# Patient Record
Sex: Female | Born: 1940 | Race: White | Hispanic: No | Smoking: Never smoker
Health system: Southern US, Community
[De-identification: ages and names within clinical notes are randomized; demographics above are authoritative.]

## PROBLEM LIST (undated history)

## (undated) DIAGNOSIS — I471 Supraventricular tachycardia, unspecified: Secondary | ICD-10-CM

## (undated) DIAGNOSIS — M1990 Unspecified osteoarthritis, unspecified site: Secondary | ICD-10-CM

## (undated) DIAGNOSIS — J302 Other seasonal allergic rhinitis: Secondary | ICD-10-CM

## (undated) DIAGNOSIS — E785 Hyperlipidemia, unspecified: Secondary | ICD-10-CM

## (undated) DIAGNOSIS — H269 Unspecified cataract: Secondary | ICD-10-CM

## (undated) DIAGNOSIS — F419 Anxiety disorder, unspecified: Secondary | ICD-10-CM

## (undated) DIAGNOSIS — K219 Gastro-esophageal reflux disease without esophagitis: Secondary | ICD-10-CM

## (undated) DIAGNOSIS — I10 Essential (primary) hypertension: Secondary | ICD-10-CM

## (undated) DIAGNOSIS — I495 Sick sinus syndrome: Secondary | ICD-10-CM

## (undated) HISTORY — DX: Essential (primary) hypertension: I10

## (undated) HISTORY — DX: Other seasonal allergic rhinitis: J30.2

## (undated) HISTORY — DX: Supraventricular tachycardia, unspecified: I47.10

## (undated) HISTORY — DX: Sick sinus syndrome: I49.5

## (undated) HISTORY — DX: Hyperlipidemia, unspecified: E78.5

## (undated) HISTORY — DX: Supraventricular tachycardia: I47.1

## (undated) HISTORY — DX: Anxiety disorder, unspecified: F41.9

## (undated) HISTORY — PX: TUBAL LIGATION: SHX77

## (undated) HISTORY — DX: Unspecified cataract: H26.9

## (undated) HISTORY — DX: Unspecified osteoarthritis, unspecified site: M19.90

## (undated) HISTORY — DX: Gastro-esophageal reflux disease without esophagitis: K21.9

---

## 1992-01-27 DIAGNOSIS — H269 Unspecified cataract: Secondary | ICD-10-CM

## 1992-01-27 HISTORY — PX: CATARACT EXTRACTION, BILATERAL: SHX1313

## 1992-01-27 HISTORY — DX: Unspecified cataract: H26.9

## 1993-01-26 HISTORY — PX: RETINAL LASER PROCEDURE: SHX2339

## 1997-12-24 ENCOUNTER — Other Ambulatory Visit: Admission: RE | Admit: 1997-12-24 | Discharge: 1997-12-24 | Payer: Self-pay | Admitting: Obstetrics and Gynecology

## 1999-01-22 ENCOUNTER — Other Ambulatory Visit: Admission: RE | Admit: 1999-01-22 | Discharge: 1999-01-22 | Payer: Self-pay | Admitting: Obstetrics and Gynecology

## 2000-01-21 ENCOUNTER — Other Ambulatory Visit: Admission: RE | Admit: 2000-01-21 | Discharge: 2000-01-21 | Payer: Self-pay | Admitting: Obstetrics and Gynecology

## 2001-01-31 ENCOUNTER — Other Ambulatory Visit: Admission: RE | Admit: 2001-01-31 | Discharge: 2001-01-31 | Payer: Self-pay | Admitting: Obstetrics and Gynecology

## 2002-02-08 ENCOUNTER — Other Ambulatory Visit: Admission: RE | Admit: 2002-02-08 | Discharge: 2002-02-08 | Payer: Self-pay | Admitting: Obstetrics and Gynecology

## 2003-02-14 ENCOUNTER — Other Ambulatory Visit: Admission: RE | Admit: 2003-02-14 | Discharge: 2003-02-14 | Payer: Self-pay | Admitting: Obstetrics and Gynecology

## 2004-01-27 HISTORY — PX: COLONOSCOPY: SHX174

## 2004-01-27 HISTORY — PX: OTHER SURGICAL HISTORY: SHX169

## 2004-03-18 ENCOUNTER — Ambulatory Visit: Payer: Self-pay | Admitting: Internal Medicine

## 2004-03-21 ENCOUNTER — Other Ambulatory Visit: Admission: RE | Admit: 2004-03-21 | Discharge: 2004-03-21 | Payer: Self-pay | Admitting: Obstetrics and Gynecology

## 2004-04-04 ENCOUNTER — Ambulatory Visit: Payer: Self-pay | Admitting: Internal Medicine

## 2005-04-10 ENCOUNTER — Other Ambulatory Visit: Admission: RE | Admit: 2005-04-10 | Discharge: 2005-04-10 | Payer: Self-pay | Admitting: Obstetrics and Gynecology

## 2006-04-30 ENCOUNTER — Other Ambulatory Visit: Admission: RE | Admit: 2006-04-30 | Discharge: 2006-04-30 | Payer: Self-pay | Admitting: Obstetrics and Gynecology

## 2007-05-13 ENCOUNTER — Other Ambulatory Visit: Admission: RE | Admit: 2007-05-13 | Discharge: 2007-05-13 | Payer: Self-pay | Admitting: Obstetrics and Gynecology

## 2009-01-26 HISTORY — PX: KNEE ARTHROSCOPY: SUR90

## 2009-04-17 ENCOUNTER — Encounter: Admission: RE | Admit: 2009-04-17 | Discharge: 2009-04-17 | Payer: Self-pay | Admitting: Sports Medicine

## 2009-04-22 ENCOUNTER — Encounter: Admission: RE | Admit: 2009-04-22 | Discharge: 2009-04-22 | Payer: Self-pay | Admitting: Orthopedic Surgery

## 2009-04-25 ENCOUNTER — Ambulatory Visit (HOSPITAL_BASED_OUTPATIENT_CLINIC_OR_DEPARTMENT_OTHER): Admission: RE | Admit: 2009-04-25 | Discharge: 2009-04-25 | Payer: Self-pay | Admitting: Orthopedic Surgery

## 2009-05-03 ENCOUNTER — Encounter: Admission: RE | Admit: 2009-05-03 | Discharge: 2009-05-03 | Payer: Self-pay | Admitting: Orthopedic Surgery

## 2009-05-24 ENCOUNTER — Encounter: Admission: RE | Admit: 2009-05-24 | Discharge: 2009-05-24 | Payer: Self-pay | Admitting: Family Medicine

## 2010-04-21 LAB — BASIC METABOLIC PANEL
Chloride: 107 mEq/L (ref 96–112)
Creatinine, Ser: 0.65 mg/dL (ref 0.4–1.2)
GFR calc Af Amer: >60 mL/min (ref >60)
Potassium: 4.1 mEq/L (ref 3.5–5.1)
Sodium: 139 mEq/L (ref 135–145)

## 2010-12-31 IMAGING — CR DG CHEST 2V
2 series · 2 of 2 positions shown · non-contrast
Comparison: Chest x-ray of 04/22/2009

CLINICAL DATA: Prominent infrahilar markings of prior chest x-ray,
follow-up

CHEST - 2 VIEW

[w chest pa]
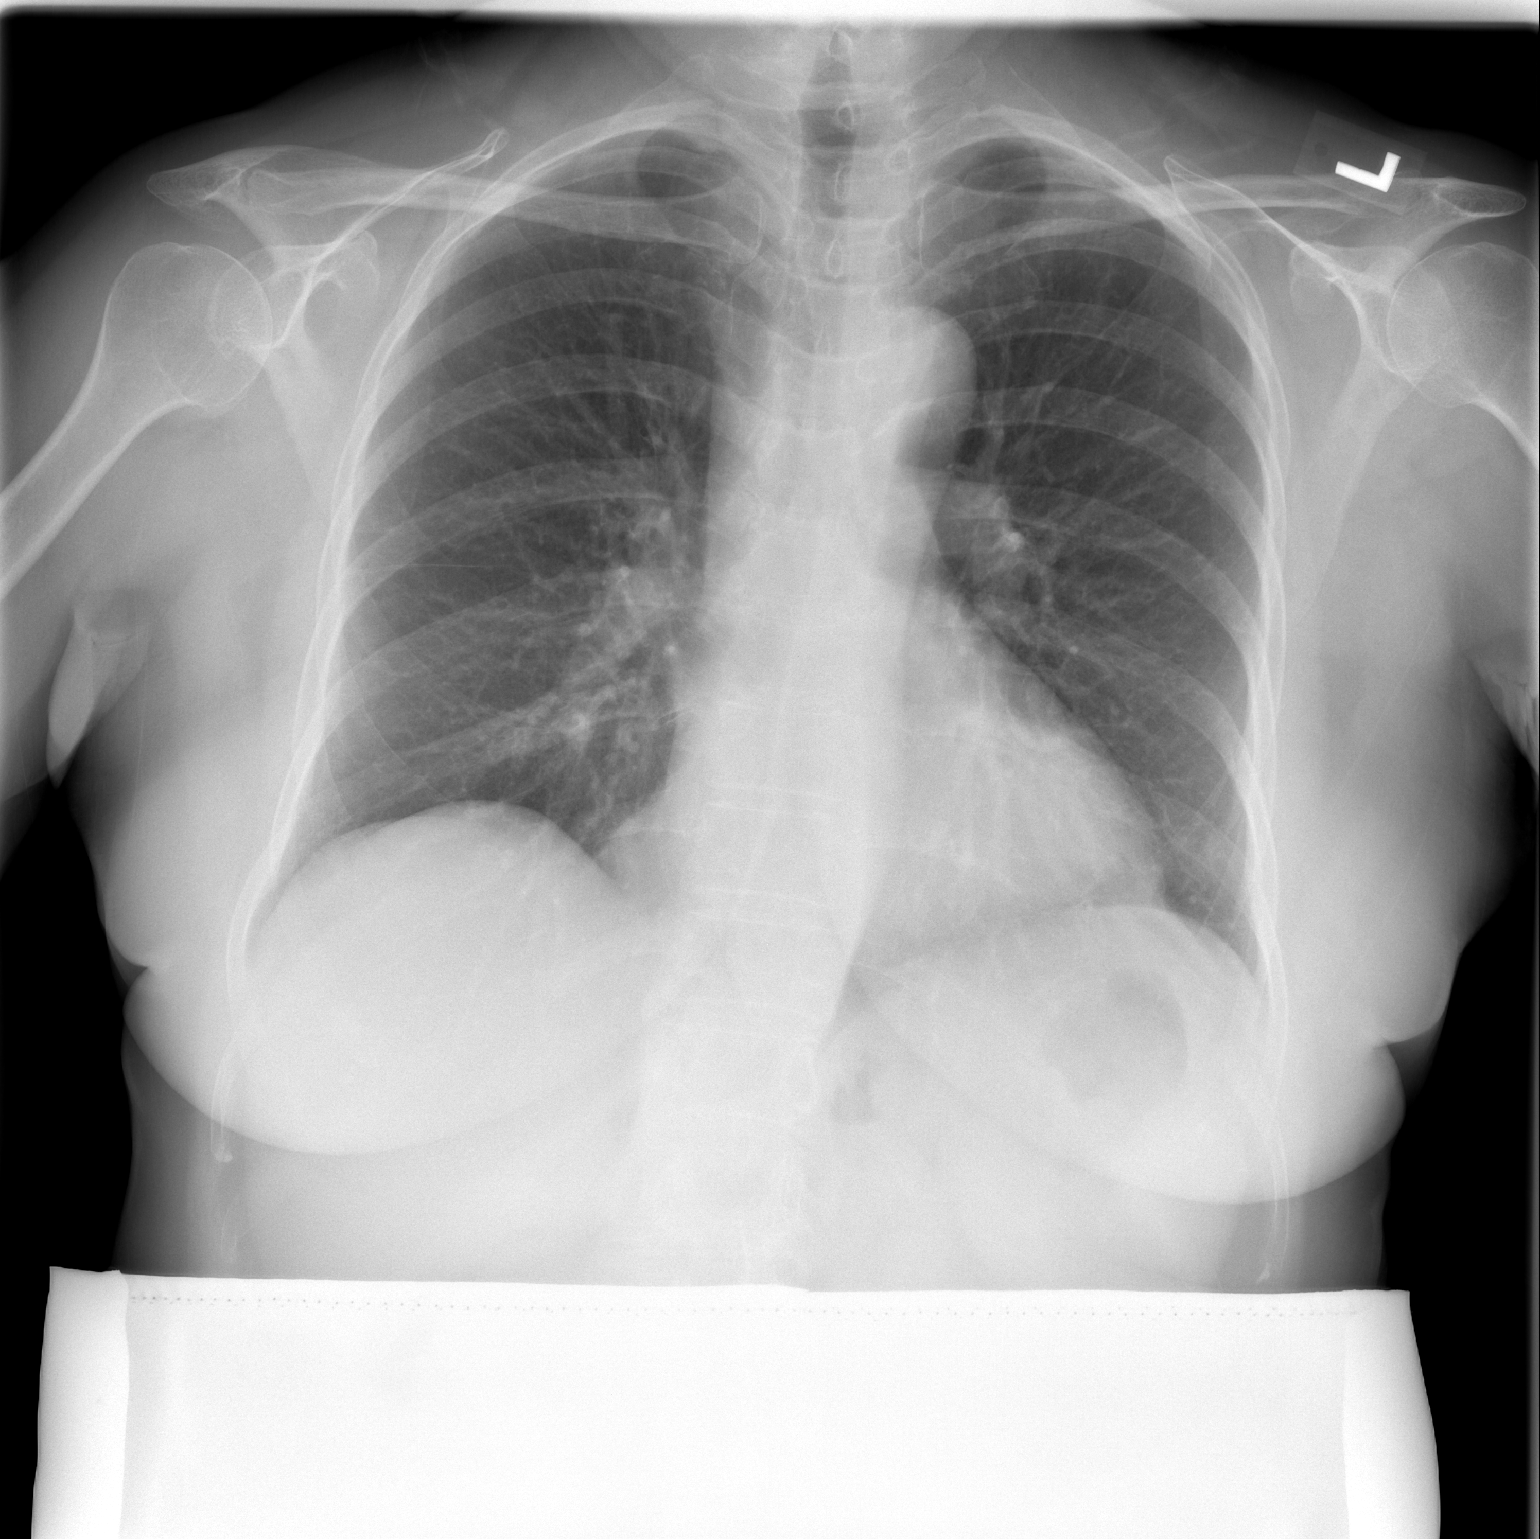

[w chest lat]
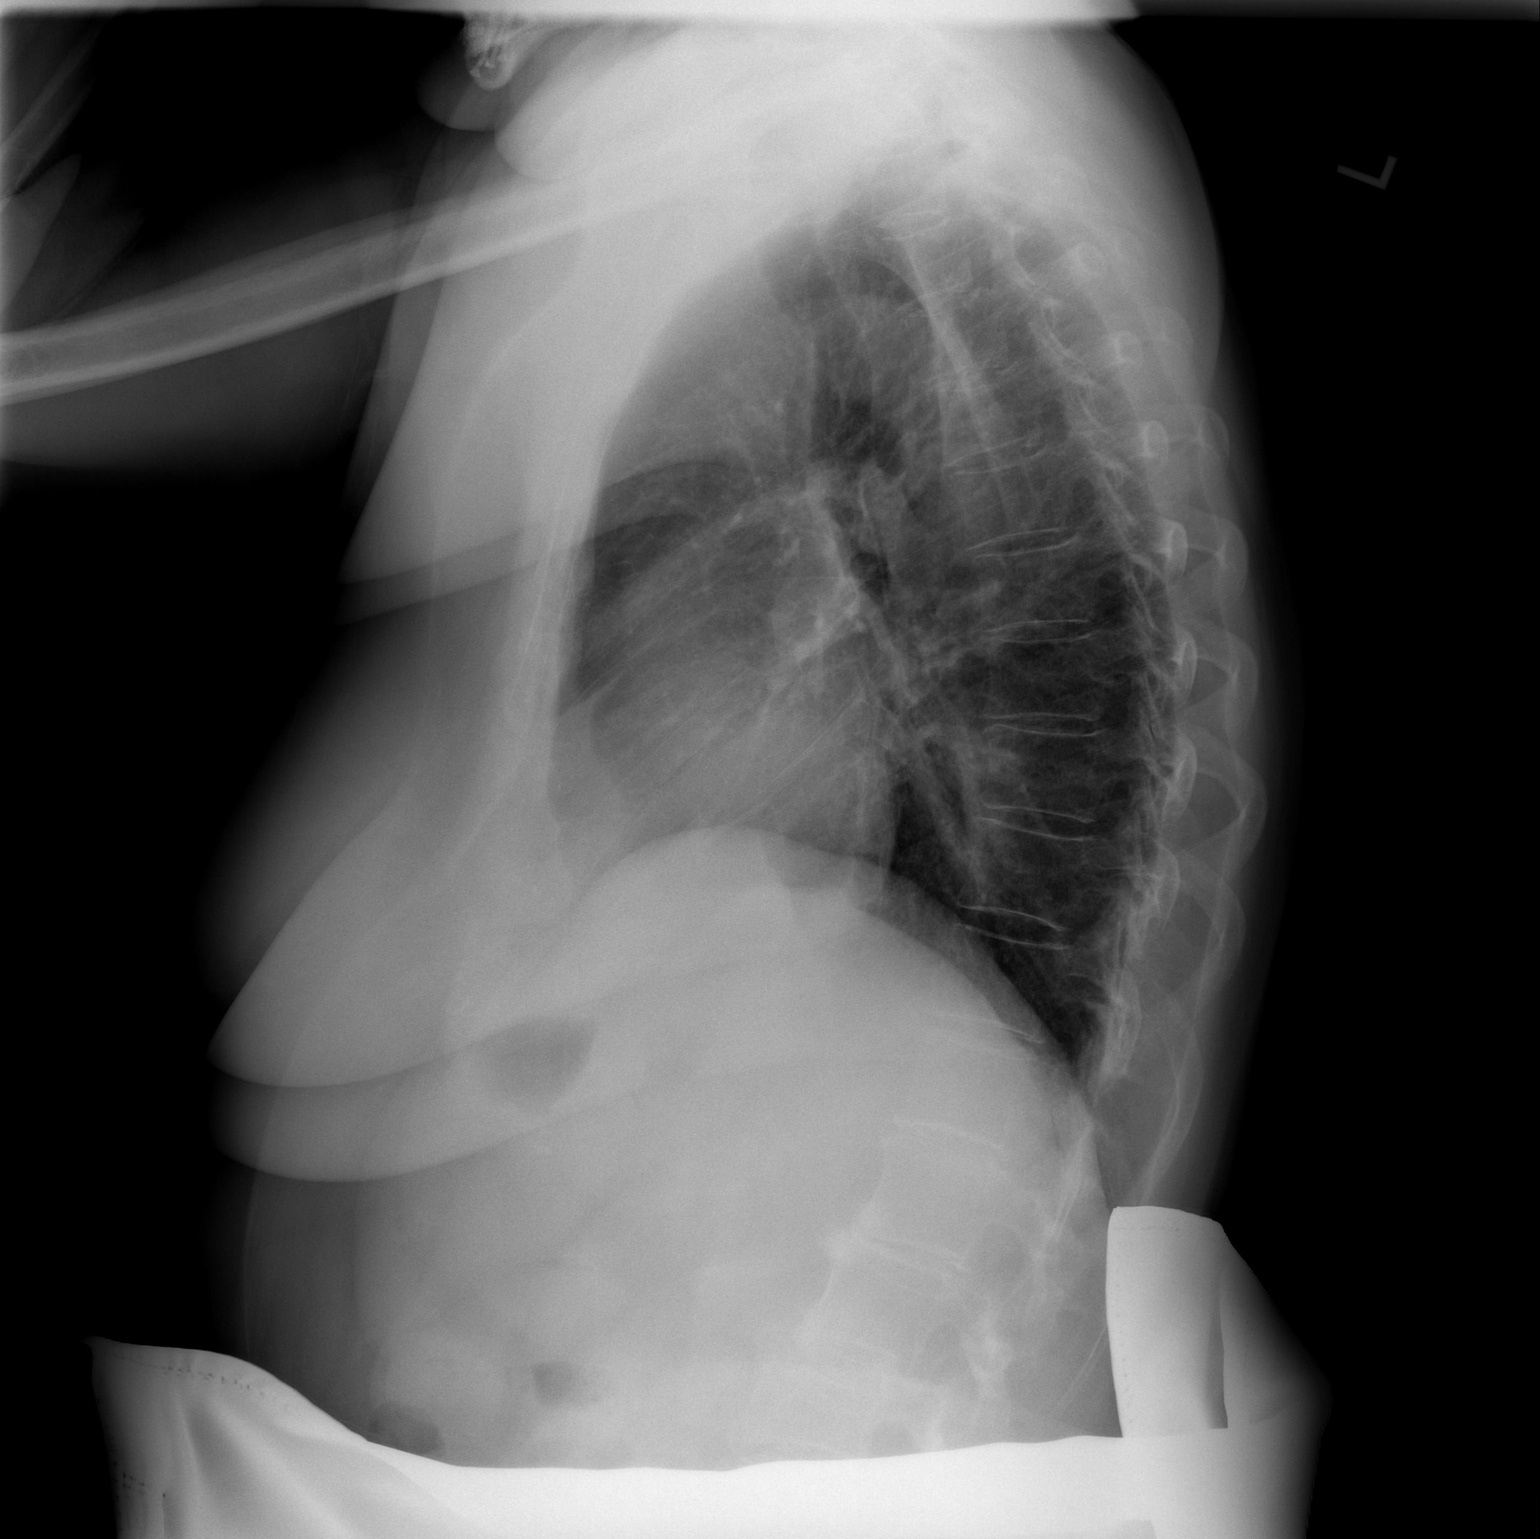

[2 of 2 positions shown; findings below may reference images not displayed]

FINDINGS: The patient has taken a better inspiration and the lungs
are clear.  No infiltrate or effusion is seen.  The heart is within
normal limits in size.  No bony abnormality is seen.
IMPRESSION: No active lung disease.  No infrahilar abnormality is noted.

## 2011-04-29 ENCOUNTER — Encounter: Payer: Self-pay | Admitting: Internal Medicine

## 2011-04-29 ENCOUNTER — Ambulatory Visit (AMBULATORY_SURGERY_CENTER): Payer: Medicare Other | Admitting: *Deleted

## 2011-04-29 VITALS — Ht 62.0 in | Wt 167.5 lb

## 2011-04-29 DIAGNOSIS — Z1211 Encounter for screening for malignant neoplasm of colon: Secondary | ICD-10-CM

## 2011-04-29 MED ORDER — PEG-KCL-NACL-NASULF-NA ASC-C 100 G PO SOLR: 1.0000 | Freq: Once | ORAL | Status: DC

## 2011-05-13 ENCOUNTER — Ambulatory Visit (AMBULATORY_SURGERY_CENTER): Payer: Medicare Other | Admitting: Internal Medicine

## 2011-05-13 ENCOUNTER — Encounter: Payer: Self-pay | Admitting: Internal Medicine

## 2011-05-13 VITALS — BP 152/71 | HR 69 | Temp 95.5°F | Resp 16 | Ht 62.0 in | Wt 167.0 lb

## 2011-05-13 DIAGNOSIS — D126 Benign neoplasm of colon, unspecified: Secondary | ICD-10-CM

## 2011-05-13 DIAGNOSIS — Z800 Family history of malignant neoplasm of digestive organs: Secondary | ICD-10-CM

## 2011-05-13 DIAGNOSIS — Z1211 Encounter for screening for malignant neoplasm of colon: Secondary | ICD-10-CM

## 2011-05-13 MED ORDER — SODIUM CHLORIDE 0.9 % IV SOLN: 500.0000 mL | INTRAVENOUS | Status: DC

## 2011-05-13 NOTE — Patient Instructions (Signed)
YOU HAD AN ENDOSCOPIC PROCEDURE TODAY AT THE [facility]: Refer to the procedure report that was given to you for any specific questions about what was found during the examination.  If the procedure report does not answer your questions, please call your gastroenterologist to clarify.  If you requested that your care partner not be given the details of your procedure findings, then the procedure report has been included in a sealed envelope for you to review at your convenience later.  YOU SHOULD EXPECT: Some feelings of bloating in the abdomen. Passage of more gas than usual.  Walking can help get rid of the air that was put into your GI tract during the procedure and reduce the bloating. If you had a lower endoscopy (such as a colonoscopy or flexible sigmoidoscopy) you may notice spotting of blood in your stool or on the toilet paper. If you underwent a bowel prep for your procedure, then you may not have a normal bowel movement for a few days.  DIET: Your first meal following the procedure should be a light meal and then it is ok to progress to your normal diet.  A half-sandwich or bowl of soup is an example of a good first meal.  Heavy or fried foods are harder to digest and may make you feel nauseous or bloated.  Likewise meals heavy in dairy and vegetables can cause extra gas to form and this can also increase the bloating.  Drink plenty of fluids but you should avoid alcoholic beverages for 24 hours.  ACTIVITY: Your care partner should take you home directly after the procedure.  You should plan to take it easy, moving slowly for the rest of the day.  You can resume normal activity the day after the procedure however you should NOT DRIVE or use heavy machinery for 24 hours (because of the sedation medicines used during the test).    SYMPTOMS TO REPORT IMMEDIATELY: A gastroenterologist can be reached at any hour.  During normal business hours, 8:30 AM to 5:00 PM Monday through Friday,  call [phone].  After hours and on weekends, please call the GI answering service at [phone] who will take a message and have the physician on call contact you.   Following lower endoscopy (colonoscopy or flexible sigmoidoscopy):  Excessive amounts of blood in the stool  Significant tenderness or worsening of abdominal pains  Swelling of the abdomen that is new, acute  Fever of 100F or higher    FOLLOW UP: If any biopsies were taken you will be contacted by phone or by letter within the next 1-3 weeks.  Call your gastroenterologist if you have not heard about the biopsies in 3 weeks.  Our staff will call the home number listed on your records the next business day following your procedure to check on you and address any questions or concerns that you may have at that time regarding the information given to you following your procedure. This is a courtesy call and so if there is no answer at the home number and we have not heard from you through the emergency physician on call, we will assume that you have returned to your regular daily activities without incident.  SIGNATURES/CONFIDENTIALITY: You and/or your care partner have signed paperwork which will be entered into your electronic medical record.  These signatures attest to the fact that that the information above on your After Visit Summary has been reviewed and is understood.  Full responsibility of the confidentiality   of this discharge information lies with you and/or your care-partner.     

## 2011-05-13 NOTE — Progress Notes (Signed)
Patient did not have preoperative order for IV antibiotic SSI prophylaxis. (G8918)  Patient did not experience any of the following events: a burn prior to discharge; a fall within the facility; wrong site/side/patient/procedure/implant event; or a hospital transfer or hospital admission upon discharge from the facility. (G8907)  

## 2011-05-13 NOTE — Progress Notes (Signed)
1110 CHANGED COLONOSCOPE TO PEDIATRIC. 1112 RESTARTED PROCEDURE.

## 2011-05-13 NOTE — Op Note (Signed)
[facility] [address] [place], [place]  [address]  COLONOSCOPY PROCEDURE REPORT  PATIENT:  Anna Dawson, Anna Dawson  MR#:  [id number] BIRTHDATE:  [birth year], 70 yrs. old  GENDER:  female ENDOSCOPIST:  Hedwig Morton. Juanda Chance, MD REF. BY:  Laurann Montana, M.D. PROCEDURE DATE:  05/13/2011 PROCEDURE:  Colonoscopy with biopsy and snare polypectomy ASA CLASS:  Class III INDICATIONS:  family history of colon cancer mother with colon cancer prior colon [phone] MEDICATIONS:   MAC sedation, administered by CRNA, propofol (Diprivan) 250 mcg  DESCRIPTION OF PROCEDURE:   After the risks and benefits and of the procedure were explained, informed consent was obtained. Digital rectal exam was performed and revealed no rectal masses. The LB CF-[id number] endoscope was introduced through the anus and advanced to the cecum, which was identified by both the appendix and ileocecal valve.  The quality of the prep was good, using MoviPrep.  The instrument was then slowly withdrawn as the colon was fully examined. <<PROCEDUREIMAGES>>  FINDINGS:  A sessile polyp was found. 4 mm flat polyp in the rectum The polyp was removed using cold biopsy forceps (see image5, image4, and image6).  Mild diverticulosis was found (see image1). sharp turn at the rectosigmoid junction  This was otherwise a normal examination of the colon (see image2 and image3).   Retroflexed views in the rectum revealed no abnormalities.    The scope was then withdrawn from the patient and the procedure completed.  COMPLICATIONS:  None ENDOSCOPIC IMPRESSION: 1) Sessile polyp 2) Mild diverticulosis 3) Otherwise normal examination RECOMMENDATIONS: 1) Await pathology results 2) High fiber diet.  REPEAT EXAM:  In 5 year(s) for.  ______________________________ Hedwig Morton. Juanda Chance, MD  CC:  n. eSIGNED:   Hedwig Morton. Naethan Bracewell at 05/13/2011 11:43 AM  Cleda Clarks, [phone]

## 2011-05-14 ENCOUNTER — Telehealth: Payer: Self-pay | Admitting: *Deleted

## 2011-05-18 ENCOUNTER — Encounter: Payer: Self-pay | Admitting: Internal Medicine

## 2012-10-14 ENCOUNTER — Emergency Department (HOSPITAL_BASED_OUTPATIENT_CLINIC_OR_DEPARTMENT_OTHER)
Admission: EM | Admit: 2012-10-14 | Discharge: 2012-10-15 | Disposition: A | Discharge status: Home / Self Care | Payer: Medicare Other | Attending: Emergency Medicine | Admitting: Emergency Medicine

## 2012-10-14 ENCOUNTER — Encounter (HOSPITAL_BASED_OUTPATIENT_CLINIC_OR_DEPARTMENT_OTHER): Payer: Self-pay | Admitting: *Deleted

## 2012-10-14 DIAGNOSIS — I10 Essential (primary) hypertension: Secondary | ICD-10-CM | POA: Insufficient documentation

## 2012-10-14 DIAGNOSIS — Z8719 Personal history of other diseases of the digestive system: Secondary | ICD-10-CM | POA: Insufficient documentation

## 2012-10-14 DIAGNOSIS — R040 Epistaxis: Secondary | ICD-10-CM | POA: Insufficient documentation

## 2012-10-14 DIAGNOSIS — Z7982 Long term (current) use of aspirin: Secondary | ICD-10-CM | POA: Insufficient documentation

## 2012-10-14 DIAGNOSIS — Z79899 Other long term (current) drug therapy: Secondary | ICD-10-CM | POA: Insufficient documentation

## 2012-10-14 DIAGNOSIS — E785 Hyperlipidemia, unspecified: Secondary | ICD-10-CM | POA: Insufficient documentation

## 2012-10-14 DIAGNOSIS — M1710 Unilateral primary osteoarthritis, unspecified knee: Secondary | ICD-10-CM | POA: Insufficient documentation

## 2012-10-14 DIAGNOSIS — Z8669 Personal history of other diseases of the nervous system and sense organs: Secondary | ICD-10-CM | POA: Insufficient documentation

## 2012-10-14 NOTE — ED Notes (Signed)
MD at bedside. 

## 2012-10-14 NOTE — ED Provider Notes (Signed)
CSN: [id number]     Arrival date & time 10/14/12  2205 History   This chart was scribed for Blanchie Zeleznik Smitty Cords, MD by Clydene Laming, ED Scribe. This patient was seen in room MH10/MH10 and the patient's care was started at 11:15 PM.   Chief Complaint  Patient presents with  . Epistaxis   Patient is a 72 y.o. female presenting with nosebleeds. The history is provided by the patient. No language interpreter was used.  Epistaxis Location:  R nare Severity:  Mild Timing:  Intermittent Progression:  Resolved Chronicity:  New Context: anticoagulants   Relieved by:  Nothing Worsened by:  Nothing tried Ineffective treatments:  None tried Associated symptoms: no blood in oropharynx, no facial pain, no sneezing and no sore throat   Risk factors: no alcohol use, no radiation treatment and no recent chemotherapy     HPI Comments: Anna Dawson is a 72 y.o. female who presents to the Emergency Department complaining of epistaxis onset tonight that occurred for ten minutes. Bleeding is controlled. Pt denies taking blood thinners. Pt expresses a hx of hypertension.  Past Medical History  Diagnosis Date  . Seasonal allergies   . Arthritis     right knee  . Cataracts, bilateral 1994  . GERD (gastroesophageal reflux disease)   . Hyperlipidemia   . Hypertension    Past Surgical History  Procedure Laterality Date  . Colonoscopy  2006  . Cataract extraction, bilateral  1994  . Retinal laser procedure  1995  . Knee arthroscopy  2011    RIGHT KNEE  . Tubal ligation    . Eyelid surgery  2006    BILATERAL   History reviewed. No pertinent family history. History  Substance Use Topics  . Smoking status: Never Smoker   . Smokeless tobacco: Never Used  . Alcohol Use: No     Comment: occasional    OB History   Grav Para Term Preterm Abortions TAB SAB Ect Mult Living                 Review of Systems  Constitutional: Negative for chills.  HENT: Positive for nosebleeds. Negative for  sore throat, sneezing and trouble swallowing.   All other systems reviewed and are negative.    Allergies  Review of patient's allergies indicates no known allergies.  Home Medications   Current Outpatient Rx  Name  Route  Sig  Dispense  Refill  . amLODipine (NORVASC) 5 MG tablet   Oral   Take 5 mg by mouth daily.         Marland Kitchen aspirin 81 MG tablet   Oral   Take 81 mg by mouth daily.         . benazepril-hydrochlorthiazide (LOTENSIN HCT) 20-12.5 MG per tablet   Oral   Take 0.5 tablets by mouth.          . cholecalciferol (VITAMIN D) 1000 UNITS tablet   Oral   Take 1,000 Units by mouth daily.         . fluticasone (FLONASE) 50 MCG/ACT nasal spray               . Glucosamine-Chondroitin (GLUCOSAMINE CHONDR COMPLEX PO)   Oral   Take 1 tablet by mouth daily.         Marland Kitchen KRILL OIL PO   Oral   Take 1 tablet by mouth daily.         . Multiple Vitamin (MULTIVITAMIN) tablet   Oral   Take 1  tablet by mouth daily.         Marland Kitchen nystatin (NYSTOP) 100000 UNIT/GM POWD               . peg 3350 powder (MOVIPREP) SOLR   Oral   Take 1 kit (100 g total) by mouth once.   1 kit   0     moviprep as directed   . pravastatin (PRAVACHOL) 80 MG tablet                Triage Vitals: BP 136/78  Pulse 76  Temp(Src) 98 F (36.7 C) (Oral)  Resp 16  Ht 5' 1.5" (1.562 m)  Wt 164 lb (74.39 kg)  BMI 30.49 kg/m2  SpO2 95% Physical Exam  Constitutional: She is oriented to person, place, and time. She appears well-developed and well-nourished. No distress.  HENT:  Head: Normocephalic and atraumatic.  Nose: No nasal septal hematoma.  Mouth/Throat: Oropharynx is clear and moist. No oropharyngeal exudate.  Moist mucus membranes No drainage down the back of the throat  Irritation in right kisselbachs plexus but no active bleeding  Eyes: Pupils are equal, round, and reactive to light.  Neck: Normal range of motion. Neck supple.  Cardiovascular: Normal rate, regular  rhythm and intact distal pulses.   Pulmonary/Chest: Effort normal and breath sounds normal. She has no wheezes. She has no rales.  Lungs clear  Abdominal: Soft. Bowel sounds are normal. There is no tenderness. There is no rebound.  Musculoskeletal: Normal range of motion.  Neurological: She is alert and oriented to person, place, and time.  Skin: Skin is warm and dry.  Psychiatric: She has a normal mood and affect.    ED Course  Procedures (including critical care time)  DIAGNOSTIC STUDIES: Oxygen Saturation is 96% on RA, normal by my interpretation.    COORDINATION OF CARE: 11:19 PM- Discussed treatment plan with pt at bedside. Pt verbalized understanding and agreement with plan.   Labs Review Labs Reviewed - No data to display Imaging Review No results found.  MDM  No diagnosis found. Return for recurrent bleeding.  Hold flonase follow up as directed with PMD and ENT I personally performed the services described in this documentation, which was scribed in my presence. The recorded information has been reviewed and is accurate.    Jasmine Awe, MD 10/15/12 [phone]

## 2012-10-14 NOTE — ED Notes (Signed)
Pt c/o nosebleed x 1 hr ago, bleeding controlled , pt denies blood thinners

## 2013-10-30 ENCOUNTER — Encounter: Payer: Self-pay | Admitting: Internal Medicine

## 2013-11-30 ENCOUNTER — Ambulatory Visit (INDEPENDENT_AMBULATORY_CARE_PROVIDER_SITE_OTHER): Payer: Medicare Other | Admitting: Cardiology

## 2013-11-30 ENCOUNTER — Encounter: Payer: Self-pay | Admitting: *Deleted

## 2013-11-30 VITALS — BP 124/80 | HR 52 | Ht 61.5 in | Wt 161.0 lb

## 2013-11-30 DIAGNOSIS — R001 Bradycardia, unspecified: Secondary | ICD-10-CM

## 2013-11-30 DIAGNOSIS — I10 Essential (primary) hypertension: Secondary | ICD-10-CM

## 2013-11-30 DIAGNOSIS — I4589 Other specified conduction disorders: Secondary | ICD-10-CM

## 2013-11-30 DIAGNOSIS — E785 Hyperlipidemia, unspecified: Secondary | ICD-10-CM

## 2013-11-30 NOTE — Patient Instructions (Signed)
Your physician recommends that you continue on your current medications as directed. Please refer to the Current Medication list given to you today.   Your physician has requested that you have an echocardiogram. Echocardiography is a painless test that uses sound waves to create images of your heart. It provides your doctor with information about the size and shape of your heart and how well your heart's chambers and valves are working. This procedure takes approximately one hour. There are no restrictions for this procedure.    Your physician has recommended that you wear a 48 hour holter monitor. Holter monitors are medical devices that record the heart's electrical activity. Doctors most often use these monitors to diagnose arrhythmias. Arrhythmias are problems with the speed or rhythm of the heartbeat. The monitor is a small, portable device. You can wear one while you do your normal daily activities. This is usually used to diagnose what is causing palpitations/syncope (passing out).   Your physician recommends that you schedule a follow-up appointment in: AT DR [place]ands

## 2013-11-30 NOTE — Progress Notes (Signed)
Patient ID: Anna Dawson, female   DOB: [birth year], 73 y.o.   MRN: [id number]    Patient Name: Anna Dawson Date of Encounter: 11/30/2013  Primary Care Provider:  Vidal Schwalbe, MD Primary Cardiologist:  Dorothy Spark  Problem List   Past Medical History  Diagnosis Date  . Seasonal allergies   . Arthritis     right knee  . Cataracts, bilateral 1994  . GERD (gastroesophageal reflux disease)   . Hyperlipidemia   . Hypertension    Past Surgical History  Procedure Laterality Date  . Colonoscopy  2006  . Cataract extraction, bilateral  1994  . Retinal laser procedure  1995  . Knee arthroscopy  2011    RIGHT KNEE  . Tubal ligation    . Eyelid surgery  2006    BILATERAL    Allergies  Allergies  Allergen Reactions  . Amlodipine Besylate     Causes heart races     HPI  73 years old with h/o HTN, hyperlipidemia, referred to [place] for bradycardia. The patient denies any prior syncope. She experiences palpitations that would last minutes and might be associated with mild dizziness, no SOB. She states that occasionally she feels dizzy and feels like she is going to pass out. She is very active, she only gets dizzy outside during exertion, not at home. No FH of CAD or SCD. She denies any chest pain, LE edema, orthopnea, PND. She gets SOB on moderate exertion.   Home Medications  Prior to Admission medications   Medication Sig Start Date End Date Taking? Authorizing Provider  aspirin 81 MG tablet Take 81 mg by mouth daily.   Yes Historical Provider, MD  benazepril-hydrochlorthiazide (LOTENSIN HCT) 20-12.5 MG per tablet Take 0.5 tablets by mouth.  03/15/11  Yes Historical Provider, MD  Cholecalciferol (VITAMIN D) 2000 UNITS CAPS Take 1 capsule by mouth daily.   Yes Historical Provider, MD  Glucosamine-Chondroitin (GLUCOSAMINE CHONDR COMPLEX PO) Take 1 tablet by mouth daily.   Yes Historical Provider, MD  Multiple Vitamin (MULTIVITAMIN) tablet Take 1 tablet by mouth daily.    Yes Historical Provider, MD  nystatin (NYSTOP) 100000 UNIT/GM POWD  04/02/11  Yes Historical Provider, MD  pravastatin (PRAVACHOL) 80 MG tablet  02/04/11  Yes Historical Provider, MD  fluticasone Asencion Islam) 50 MCG/ACT nasal spray  04/15/11   Historical Provider, MD    Family History  Family History  Problem Relation Age of Onset  . CVA Father   . Hypertension Father   . Hypercholesterolemia Father   . Colon cancer Mother   . Hypertension Mother   . Hypertension Sister   . Hypercholesterolemia Sister   . Arrhythmia Sister   . Lung cancer Sister     non-smoker  . Mitral valve prolapse Sister     Social History  History   Social History  . Marital Status: Divorced    Spouse Name: N/A    Number of Children: N/A  . Years of Education: N/A   Occupational History  . Not on file.   Social History Main Topics  . Smoking status: Never Smoker   . Smokeless tobacco: Never Used  . Alcohol Use: No     Comment: occasional   . Drug Use: Not on file  . Sexual Activity: Not on file   Other Topics Concern  . Not on file   Social History Narrative     Review of Systems, as per HPI, otherwise negative General:  No chills, fever, night sweats or weight  changes.  Cardiovascular:  No chest pain, dyspnea on exertion, edema, orthopnea, palpitations, paroxysmal nocturnal dyspnea. Dermatological: No rash, lesions/masses Respiratory: No cough, dyspnea Urologic: No hematuria, dysuria Abdominal:   No nausea, vomiting, diarrhea, bright red blood per rectum, melena, or hematemesis Neurologic:  No visual changes, wkns, changes in mental status. All other systems reviewed and are otherwise negative except as noted above.  Physical Exam  Blood pressure 124/80, pulse 52, height 5' 1.5" (1.562 m), weight 161 lb (73.029 kg).  General: Pleasant, NAD Psych: Normal affect. Neuro: Alert and oriented X 3. Moves all extremities spontaneously. HEENT: Normal  Neck: Supple without bruits or  JVD. Lungs:  Resp regular and unlabored, CTA. Heart: RRR no s3, s4, or murmurs. Abdomen: Soft, non-tender, non-distended, BS + x 4.  Extremities: No clubbing, cyanosis or edema. DP/PT/Radials 2+ and equal bilaterally.  Labs:  No results for input(s): CKTOTAL, CKMB, TROPONINI in the last 72 hours. Lab Results  Component Value Date   HGB 14.9 04/25/2009    No results found for: DDIMER Invalid input(s): POCBNP    Component Value Date/Time   NA 139 04/22/2009 1500   K 4.1 04/22/2009 1500   CL 107 04/22/2009 1500   CO2 24 04/22/2009 1500   GLUCOSE 117* 04/22/2009 1500   BUN 18 04/22/2009 1500   CREATININE 0.65 04/22/2009 1500   CALCIUM 9.6 04/22/2009 1500   GFRNONAA >60 04/22/2009 1500   GFRAA  04/22/2009 1500    >60        The eGFR has been calculated using the MDRD equation. This calculation has not been validated in all clinical situations. eGFR's persistently <60 mL/min signify possible Chronic Kidney Disease.   No results found for: CHOL, HDL, LDLCALC, TRIG  Accessory Clinical Findings  echocardiogram  ECG - Sinus bradycardia with significant sinus arrhythmia, 1.8 sec pause on ECG reporting, PVC, non-specific ST-T wave abnormalities    Assessment & Plan  73 year old female  1. 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  We will order 48 hour Holter to evaluate for significant AVB and/or arrhythmias. We might consider exercise treadmill test to evaluate for chronotropic incompetence.  TSH normal. We will also order echo to rule out structural heart disease.   2. HTN - controlled  3. Hyperlipidemia - TG 132, HDL 60, LDL 98 - at goal  Follow up in 1 month.  Dorothy Spark, MD, [facility] 11/30/2013, 11:35 AM

## 2013-12-06 ENCOUNTER — Ambulatory Visit (HOSPITAL_COMMUNITY): Payer: Medicare Other | Attending: Cardiology

## 2013-12-06 ENCOUNTER — Encounter (INDEPENDENT_AMBULATORY_CARE_PROVIDER_SITE_OTHER): Payer: Medicare Other

## 2013-12-06 ENCOUNTER — Encounter: Payer: Self-pay | Admitting: *Deleted

## 2013-12-06 DIAGNOSIS — E785 Hyperlipidemia, unspecified: Secondary | ICD-10-CM | POA: Insufficient documentation

## 2013-12-06 DIAGNOSIS — R001 Bradycardia, unspecified: Secondary | ICD-10-CM

## 2013-12-06 DIAGNOSIS — I10 Essential (primary) hypertension: Secondary | ICD-10-CM | POA: Diagnosis not present

## 2013-12-06 NOTE — Progress Notes (Signed)
Patient ID: Anna Dawson, female   DOB: [birth year], 73 y.o.   MRN: [id number] [facility] 48 hour holter monitor applied to patient.

## 2013-12-06 NOTE — Progress Notes (Signed)
2D Echo completed. 12/06/2013

## 2013-12-14 ENCOUNTER — Telehealth: Payer: Self-pay | Admitting: *Deleted

## 2013-12-14 DIAGNOSIS — I4589 Other specified conduction disorders: Secondary | ICD-10-CM

## 2013-12-14 DIAGNOSIS — I442 Atrioventricular block, complete: Secondary | ICD-10-CM

## 2013-12-14 NOTE — Telephone Encounter (Signed)
Notified the pt of her 48 hour holter monitor results per Dr Meda Coffee showing possible AIVR and chronotropic incompetence.  Informed the pt that per Dr Meda Coffee she will need a 30 day e-cardio monitor to evaluate the possible diagnosis mentioned and to obtain more data.  Informed the pt that I will send our schedulers a message to call and follow-up with the pt, to arrange an appt to get the ecardio monitor placed on.  Pt verbalized understanding and agrees with this plan.

## 2013-12-19 ENCOUNTER — Encounter (INDEPENDENT_AMBULATORY_CARE_PROVIDER_SITE_OTHER): Payer: Medicare Other

## 2013-12-19 ENCOUNTER — Encounter: Payer: Self-pay | Admitting: *Deleted

## 2013-12-19 DIAGNOSIS — I442 Atrioventricular block, complete: Secondary | ICD-10-CM

## 2013-12-19 DIAGNOSIS — I4589 Other specified conduction disorders: Secondary | ICD-10-CM

## 2013-12-19 DIAGNOSIS — I470 Re-entry ventricular arrhythmia: Secondary | ICD-10-CM

## 2013-12-19 NOTE — Progress Notes (Signed)
Patient ID: Anna Dawson, female   DOB: 1940-10-28, 73 y.o.   MRN: [id number] [employer] verite 30 day cardiac event monitor applied to patient.

## 2014-01-01 ENCOUNTER — Telehealth: Payer: Self-pay | Admitting: *Deleted

## 2014-01-01 NOTE — Telephone Encounter (Signed)
Contacted the pt to ask her symptoms because noted on her cardiac event monitor day 14 report showing serious notification of SVT at 12:53 am.  Pt reports this woke her up from sleeping and noted a "fluttering sensation" in her chest.  Pt complains of no sob, cp, "fluttering" sensations, syncopal, pre-syncopal episodes at this time.  Dr Meda Coffee is aware of these results.  No new orders at this time.  Pt has an ov with Dr Meda Coffee on Monday 12/14.

## 2014-01-08 ENCOUNTER — Encounter: Payer: Self-pay | Admitting: Cardiology

## 2014-01-08 ENCOUNTER — Ambulatory Visit (INDEPENDENT_AMBULATORY_CARE_PROVIDER_SITE_OTHER): Payer: Medicare Other | Admitting: Cardiology

## 2014-01-08 VITALS — BP 138/84 | HR 69 | Ht 61.5 in | Wt 163.0 lb

## 2014-01-08 DIAGNOSIS — I471 Supraventricular tachycardia: Secondary | ICD-10-CM

## 2014-01-08 DIAGNOSIS — R001 Bradycardia, unspecified: Secondary | ICD-10-CM

## 2014-01-08 DIAGNOSIS — I455 Other specified heart block: Secondary | ICD-10-CM

## 2014-01-08 DIAGNOSIS — R42 Dizziness and giddiness: Secondary | ICD-10-CM

## 2014-01-08 NOTE — Patient Instructions (Signed)
Your physician wants you to follow-up in: 6 MONTHS You will receive a reminder letter in the mail two months in advance. If you don't receive a letter, please call our office to schedule the follow-up appointment.  You have been referred to ELECTROPHYSIOLOGY, PLEASE SCHEDULE PATIENT FOR NEW PATIENT APPOINTMENT.

## 2014-01-08 NOTE — Progress Notes (Signed)
Patient ID: Anna Dawson, female   DOB: [birth year], 73 y.o.   MRN: [id number]    Patient Name: Anna Dawson Date of Encounter: 01/08/2014  Primary Care Provider:  Vidal Schwalbe, MD Primary Cardiologist:  Dorothy Spark  Problem List   Past Medical History  Diagnosis Date  . Seasonal allergies   . Arthritis     right knee  . Cataracts, bilateral 1994  . GERD (gastroesophageal reflux disease)   . Hyperlipidemia   . Hypertension    Past Surgical History  Procedure Laterality Date  . Colonoscopy  2006  . Cataract extraction, bilateral  1994  . Retinal laser procedure  1995  . Knee arthroscopy  2011    RIGHT KNEE  . Tubal ligation    . Eyelid surgery  2006    BILATERAL    Allergies  Allergies  Allergen Reactions  . Amlodipine Besylate     Causes heart races     HPI  73 years old with h/o HTN, hyperlipidemia, referred to [place] for bradycardia. The patient denies any prior syncope. She experiences palpitations that would last minutes and might be associated with mild dizziness, no SOB. She states that occasionally she feels dizzy and feels like she is going to pass out, but those episodes are not associated with palpitations. She is quite active, she only gets dizzy outside during exertion, not at home. No FH of CAD or SCD. She denies any chest pain, LE edema, orthopnea, PND. She gets SOB on moderate exertion.   The patient underwent 48 hour Holter monitor that showed 8 pauses > 2 seconds long with 1 lasting 2.2 seconds at noon. She is currently undergoing 30 day e-cardio monitoring. We were notified on 1 occassion that patient's HR was 156 BPM.  The patient was called and stated it at the time she was feeling dizzy page resolved within 5-10 minutes. These experiences various care for her and she felt very anxious. E cardio monitor showed SVT retrograde P-wave  after the QRS complex.   This happened on December 7. She is not had episode like that since then.  Home  Medications  Prior to Admission medications   Medication Sig Start Date End Date Taking? Authorizing Provider  aspirin 81 MG tablet Take 81 mg by mouth daily.   Yes Historical Provider, MD  benazepril-hydrochlorthiazide (LOTENSIN HCT) 20-12.5 MG per tablet Take 0.5 tablets by mouth.  03/15/11  Yes Historical Provider, MD  Cholecalciferol (VITAMIN D) 2000 UNITS CAPS Take 1 capsule by mouth daily.   Yes Historical Provider, MD  Glucosamine-Chondroitin (GLUCOSAMINE CHONDR COMPLEX PO) Take 1 tablet by mouth daily.   Yes Historical Provider, MD  Multiple Vitamin (MULTIVITAMIN) tablet Take 1 tablet by mouth daily.   Yes Historical Provider, MD  nystatin (NYSTOP) 100000 UNIT/GM POWD  04/02/11  Yes Historical Provider, MD  pravastatin (PRAVACHOL) 80 MG tablet  02/04/11  Yes Historical Provider, MD  fluticasone Asencion Islam) 50 MCG/ACT nasal spray  04/15/11   Historical Provider, MD    Family History  Family History  Problem Relation Age of Onset  . CVA Father   . Hypertension Father   . Hypercholesterolemia Father   . Colon cancer Mother   . Hypertension Mother   . Hypertension Sister   . Hypercholesterolemia Sister   . Arrhythmia Sister   . Lung cancer Sister     non-smoker  . Mitral valve prolapse Sister     Social History  History   Social History  . Marital Status:  Divorced    Spouse Name: N/A    Number of Children: N/A  . Years of Education: N/A   Occupational History  . Not on file.   Social History Main Topics  . Smoking status: Never Smoker   . Smokeless tobacco: Never Used  . Alcohol Use: No     Comment: occasional   . Drug Use: Not on file  . Sexual Activity: Not on file   Other Topics Concern  . Not on file   Social History Narrative     Review of Systems, as per HPI, otherwise negative General:  No chills, fever, night sweats or weight changes.  Cardiovascular:  No chest pain, dyspnea on exertion, edema, orthopnea, palpitations, paroxysmal nocturnal  dyspnea. Dermatological: No rash, lesions/masses Respiratory: No cough, dyspnea Urologic: No hematuria, dysuria Abdominal:   No nausea, vomiting, diarrhea, bright red blood per rectum, melena, or hematemesis Neurologic:  No visual changes, wkns, changes in mental status. All other systems reviewed and are otherwise negative except as noted above.  Physical Exam  Blood pressure 138/84, pulse 69, height 5' 1.5" (1.562 m), weight 163 lb (73.936 kg), SpO2 96 %.  General: Pleasant, NAD Psych: Normal affect. Neuro: Alert and oriented X 3. Moves all extremities spontaneously. HEENT: Normal  Neck: Supple without bruits or JVD. Lungs:  Resp regular and unlabored, CTA. Heart: RRR no s3, s4, or murmurs. Abdomen: Soft, non-tender, non-distended, BS + x 4.  Extremities: No clubbing, cyanosis or edema. DP/PT/Radials 2+ and equal bilaterally.  Labs:  No results for input(s): CKTOTAL, CKMB, TROPONINI in the last 72 hours. Lab Results  Component Value Date   HGB 14.9 04/25/2009    No results found for: DDIMER Invalid input(s): POCBNP    Component Value Date/Time   NA 139 04/22/2009 1500   K 4.1 04/22/2009 1500   CL 107 04/22/2009 1500   CO2 24 04/22/2009 1500   GLUCOSE 117* 04/22/2009 1500   BUN 18 04/22/2009 1500   CREATININE 0.65 04/22/2009 1500   CALCIUM 9.6 04/22/2009 1500   GFRNONAA >60 04/22/2009 1500   GFRAA  04/22/2009 1500    >60        The eGFR has been calculated using the MDRD equation. This calculation has not been validated in all clinical situations. eGFR's persistently <60 mL/min signify possible Chronic Kidney Disease.   No results found for: CHOL, HDL, LDLCALC, TRIG  Accessory Clinical Findings  Echocardiogram - 12/06/2013 Study Conclusions  - Left ventricle: The cavity size was normal. Wall thickness was normal. Systolic function was normal. The estimated ejection fraction was in the range of 55% to 60%. Wall motion was normal; there were no  regional wall motion abnormalities. Doppler parameters are consistent with abnormal left ventricular relaxation (grade 1 diastolic dysfunction). The E/e&' ratio is between 8-15, suggesting indeterminate LV filling pressure. - Mitral valve: Calcified annulus. There was trivial regurgitation. - Left atrium: The atrium was normal in size. - Tricuspid valve: There was mild regurgitation. - Pulmonary arteries: PA peak pressure: 40 mm Hg (S).  Impressions:  - LVEF 55-60%, normal wall thickness, diastolic dysfunction, indeterminate LV filling pressure, normal LA size, mild TR, RVSP 40 mmHg.  ECG - Sinus bradycardia with significant sinus arrhythmia, 1.8 sec pause on ECG reporting, PVC, non-specific ST-T wave abnormalities    Assessment & Plan  73 year old female  1. Sinus bradycardia with frequent pauses lasting up to 2.2 seconds with sinus pauses occuring during the day that are not symptomatic. The patient has  possible sick sinus syndrome and chronotropic incompetence.  However she states she will be able to walk on a treadmill.  E- cardio monitor showed an episode of SVT with HR 156 BPM associated with presyncope. The patient is experiencing significant anxiety related to these episodes.   We will schedule an EP visit for possible ablation of SVT. No therapy at this point as she has baseline bradycardia. I don't believe she qualifies for a pacemaker considering that her pauses are shorter than 3 seconds and she is not symptomatic with them. TSH normal.  Echocardiogram showed normal biventricular function , no significant valvular abnormality, borderline pulmonary HTN.  2. HTN - controlled  3. Hyperlipidemia - TG 132, HDL 60, LDL 98 - at goal  Follow up with EP, with me in 6 months.    Dorothy Spark, MD, [facility] 01/08/2014, 12:52 PM

## 2014-01-10 ENCOUNTER — Ambulatory Visit: Payer: Medicare Other | Admitting: Cardiology

## 2014-01-30 ENCOUNTER — Telehealth: Payer: Self-pay | Admitting: *Deleted

## 2014-01-30 NOTE — Telephone Encounter (Signed)
error 

## 2014-01-31 ENCOUNTER — Encounter: Payer: Self-pay | Admitting: *Deleted

## 2014-01-31 ENCOUNTER — Ambulatory Visit (INDEPENDENT_AMBULATORY_CARE_PROVIDER_SITE_OTHER): Payer: Medicare Other | Admitting: Internal Medicine

## 2014-01-31 ENCOUNTER — Encounter: Payer: Self-pay | Admitting: Internal Medicine

## 2014-01-31 VITALS — BP 138/86 | HR 71 | Ht 61.5 in | Wt 163.0 lb

## 2014-01-31 DIAGNOSIS — I10 Essential (primary) hypertension: Secondary | ICD-10-CM

## 2014-01-31 DIAGNOSIS — I495 Sick sinus syndrome: Secondary | ICD-10-CM

## 2014-01-31 DIAGNOSIS — I471 Supraventricular tachycardia: Secondary | ICD-10-CM

## 2014-01-31 NOTE — Patient Instructions (Addendum)
Your physician has recommended that you have an ablation. Catheter ablation is a medical procedure used to treat some cardiac arrhythmias (irregular heartbeats). During catheter ablation, a long, thin, flexible tube is put into a blood vessel in your groin (upper thigh), or neck. This tube is called an ablation catheter. It is then guided to your heart through the blood vessel. Radio frequency waves destroy small areas of heart tissue where abnormal heartbeats may cause an arrhythmia to start. Please see the instruction sheet given to you today.  See instruction sheet for procedure  Your physician recommends that you return for lab work on 02/16/14  Supraventricular Tachycardia Supraventricular tachycardia (SVT) is an abnormal heart rhythm (arrhythmia) that causes the heart to beat very fast (tachycardia). This kind of fast heartbeat originates in the upper chambers of the heart (atria). SVT can cause the heart to beat greater than 100 beats per minute. SVT can have a rapid burst of heartbeats. This can start and stop suddenly without warning and is called nonsustained. SVT can also be sustained, in which the heart beats at a continuous fast rate.  CAUSES  There can be different causes of SVT. Some of these include:  Heart valve problems such as mitral valve prolapse.  An enlarged heart (hypertrophic cardiomyopathy).  Congenital heart problems.  Heart inflammation (pericarditis).  Hyperthyroidism.  Low potassium or magnesium levels.  Caffeine.  Drug use such as cocaine, methamphetamines, or stimulants.  Some over-the-counter medicines such as:  Decongestants.  Diet medicines.  Herbal medicines. SYMPTOMS  Symptoms of SVT can vary. Symptoms depend on whether the SVT is sustained or nonsustained. You may experience:  No symptoms (asymptomatic).  An awareness of your heart beating rapidly (palpitations).  Shortness of breath.  Chest pain or pressure. If your blood pressure  drops because of the SVT, you may experience:  Fainting or near fainting.  Weakness.  Dizziness. DIAGNOSIS  Different tests can be performed to diagnose SVT, such as:  An electrocardiogram (EKG). This is a painless test that records the electrical activity of your heart.  Holter monitor. This is a 24 hour recording of your heart rhythm. You will be given a diary. Write down all symptoms that you have and what you were doing at the time you experienced symptoms.  Arrhythmia monitor. This is a small device that your wear for several weeks. It records the heart rhythm when you have symptoms.  Echocardiogram. This is an imaging test to help detect abnormal heart structure such as congenital abnormalities, heart valve problems, or heart enlargement.  Stress test. This test can help determine if the SVT is related to exercise.  Electrophysiology study (EPS). This is a procedure that evaluates your heart's electrical system and can help your caregiver find the cause of your SVT. TREATMENT  Treatment of SVT depends on the symptoms, how often it recurs, and whether there are any underlying heart problems.   If symptoms are rare and no other cardiac disease is present, no treatment may be needed.  Blood work may be done to check potassium, magnesium, and thyroid hormone levels to see if they are abnormal. If these levels are abnormal, treatment to correct the problems will occur. Medicines Your caregiver may use oral medicines to treat SVT. These medicines are given for long-term control of SVT. Medicines may be used alone or in combination with other treatments. These medicines work to slow nerve impulses in the heart muscle. These medicines can also be used to treat high blood pressure. Some  of these medicines may include:  Calcium channel blockers.  Beta blockers.  Digoxin. Nonsurgical procedures Nonsurgical techniques may be used if oral medicines do not work. Some examples  include:  Cardioversion. This technique uses either drugs or an electrical shock to restore a normal heart rhythm.  Cardioversion drugs may be given through an intravenous (IV) line to help "reset" the heart rhythm.  In electrical cardioversion, the caregiver shocks your heart to stop its beat for a split second. This helps to reset the heart to a normal rhythm.  Ablation. This procedure is done under mild sedation. High frequency radio wave energy is used to destroy the area of heart tissue responsible for the SVT. HOME CARE INSTRUCTIONS   Do not smoke.  Only take medicines prescribed by your caregiver. Check with your caregiver before using over-the-counter medicines.  Check with your caregiver about how much alcohol and caffeine (coffee, tea, colas, or chocolate) you may have.  It is very important to keep all follow-up referrals and appointments in order to properly manage this problem. SEEK IMMEDIATE MEDICAL CARE IF:  You have dizziness.  You faint or nearly faint.  You have shortness of breath.  You have chest pain or pressure.  You have sudden nausea or vomiting.  You have profuse sweating.  You are concerned about how long your symptoms last.  You are concerned about the frequency of your SVT episodes. If you have the above symptoms, call your local emergency services (911 in [place] immediately. Do not drive yourself to the hospital. MAKE SURE YOU:   Understand these instructions.  Will watch your condition.  Will get help right away if you are not doing well or get worse. Document Released: 01/12/2005 Document Revised: 04/06/2011 Document Reviewed: 04/26/2008 [facility] Patient Information 2015 [place], [place]. This information is not intended to replace advice given to you by your health care provider. Make sure you discuss any questions you have with your health care provider.

## 2014-02-01 ENCOUNTER — Encounter: Payer: Self-pay | Admitting: Internal Medicine

## 2014-02-01 DIAGNOSIS — I10 Essential (primary) hypertension: Secondary | ICD-10-CM | POA: Insufficient documentation

## 2014-02-01 DIAGNOSIS — I495 Sick sinus syndrome: Secondary | ICD-10-CM | POA: Insufficient documentation

## 2014-02-01 DIAGNOSIS — I471 Supraventricular tachycardia: Secondary | ICD-10-CM | POA: Insufficient documentation

## 2014-02-01 NOTE — Progress Notes (Signed)
Primary Care Physician: Vidal Schwalbe, MD Referring Physician:  Dr Anna Dawson is a 74 y.o. female with a h/o recently diagnosed sick sinus syndrome and also SVT who presents for EP consultation.  She reports having prior palpitations lasting 1-2 minutes with associated dizziness.  Rarely, this dizziness is associated with presyncope.  She has not had full syncope.  She is very active.  The patient underwent 48 hour Holter monitor that showed sinus pauses, mostly noctural.  There was a single pause at noon of 2.2 seconds for which she does not recall having symptoms.   She then had a 30 day monitor placed which documented SVT at 150 bpm.  She reports associated palpitations and dizziness with this.  She also had associated anxiety.  She has done well since, without any recurrent symptoms of dizziness or SVT.  Today, she denies symptoms of chest pain, shortness of breath, orthopnea, PND, lower extremity edema,   or neurologic sequela. The patient is tolerating medications without difficulties and is otherwise without complaint today.   Past Medical History  Diagnosis Date  . Seasonal allergies   . Arthritis     right knee  . Cataracts, bilateral 1994  . GERD (gastroesophageal reflux disease)   . Hyperlipidemia   . Hypertension   . SVT (supraventricular tachycardia)   . Sick sinus syndrome    Past Surgical History  Procedure Laterality Date  . Colonoscopy  2006  . Cataract extraction, bilateral  1994  . Retinal laser procedure  1995  . Knee arthroscopy  2011    RIGHT KNEE  . Tubal ligation    . Eyelid surgery  2006    BILATERAL    Current Outpatient Prescriptions  Medication Sig Dispense Refill  . aspirin 81 MG tablet Take 81 mg by mouth daily.    . benazepril-hydrochlorthiazide (LOTENSIN HCT) 20-12.5 MG per tablet Take 0.5 tablets by mouth daily.     . Cholecalciferol (VITAMIN D) 2000 UNITS CAPS Take 1 capsule by mouth daily.    . Glucosamine-Chondroitin  (GLUCOSAMINE CHONDR COMPLEX PO) Take 1 tablet by mouth daily.    . Multiple Vitamin (MULTIVITAMIN) tablet Take 1 tablet by mouth daily.    Marland Kitchen nystatin (NYSTOP) 100000 UNIT/GM POWD Apply 1 g topically daily as needed (itching).     . pravastatin (PRAVACHOL) 80 MG tablet Take 80 mg by mouth daily.      No current facility-administered medications for this visit.    Allergies  Allergen Reactions  . Amlodipine Besylate     Causes heart to race and swelling in both ankles     History   Social History  . Marital Status: Divorced    Spouse Name: N/A    Number of Children: N/A  . Years of Education: N/A   Occupational History  . Not on file.   Social History Main Topics  . Smoking status: Never Smoker   . Smokeless tobacco: Never Used  . Alcohol Use: No     Comment: occasional   . Drug Use: Not on file  . Sexual Activity: Not on file   Other Topics Concern  . Not on file   Social History Narrative   Lives along in [place].  Retired.    Family History  Problem Relation Age of Onset  . CVA Father   . Hypertension Father   . Hypercholesterolemia Father   . Colon cancer Mother   . Hypertension Mother   . Hypertension Sister   . Hypercholesterolemia  Sister   . Arrhythmia Sister   . Lung cancer Sister     non-smoker  . Mitral valve prolapse Sister     ROS- All systems are reviewed and negative except as per the HPI above  Physical Exam: Filed Vitals:   01/31/14 0839  BP: 138/86  Pulse: 71  Height: 5' 1.5" (1.562 m)  Weight: 163 lb (73.936 kg)    GEN- The patient is well appearing, alert and oriented x 3 today.   Head- normocephalic, atraumatic Eyes-  Sclera clear, conjunctiva pink Ears- hearing intact Oropharynx- clear Neck- supple, no JVP Lymph- no cervical lymphadenopathy Lungs- Clear to ausculation bilaterally, normal work of breathing Heart- Regular rate and rhythm, no murmurs, rubs or gallops, PMI not laterally displaced GI- soft, NT, ND, +  BS Extremities- no clubbing, cyanosis, or edema MS- no significant deformity or atrophy Skin- no rash or lesion Psych- euthymic mood, full affect Neuro- strength and sensation are intact  EKG today reveals sinus rhythm 71 bpm, PR 156, QRS 92, Qtc 439, low voltage, poor R wave progression holter and event monitor are reviewed Epic records are reviewed  Assessment and Plan:  1.  The patient has recurrent symptomatic short RP SVT at 156 bpm observed on event monitor with associated dizziness and palpitations.  Medical options are limited by bradycardia (see below).  Therapeutic strategies for supraventricular tachycardia including medicine and ablation were discussed in detail with the patient today. Risk, benefits, and alternatives to EP study and radiofrequency ablation were also discussed in detail today. These risks include but are not limited to stroke, bleeding, vascular damage, tamponade, perforation, damage to the heart and other structures, AV block requiring pacemaker, worsening renal function, and death. The patient understands these risk and wishes to proceed.  We will therefore proceed with catheter ablation at the next available time. We will schedule anesthesia for the case.  2. Sinus bradycardia She has had multiple episodes of sinus bradycardia documented, though most of these were nocturnal and asymptomatic on her holter.  She did not have significant brady events on her event monitor.  I would favor treatment of SVT (as above) with ablation and then follow bradycardia with "watchful waiting".  3. HTN Stable No change required today

## 2014-02-16 ENCOUNTER — Other Ambulatory Visit: Payer: Medicare Other

## 2014-02-19 ENCOUNTER — Other Ambulatory Visit (INDEPENDENT_AMBULATORY_CARE_PROVIDER_SITE_OTHER): Payer: Medicare Other | Admitting: *Deleted

## 2014-02-19 DIAGNOSIS — I471 Supraventricular tachycardia: Secondary | ICD-10-CM

## 2014-02-19 LAB — BASIC METABOLIC PANEL
BUN: 15 mg/dL (ref 6–23)
CO2: 28 meq/L (ref 19–32)
CREATININE: 0.7 mg/dL (ref 0.40–1.20)
Calcium: 9.4 mg/dL (ref 8.4–10.5)
Chloride: 105 mEq/L (ref 96–112)
GFR: 86.6 mL/min (ref >60.00)
GLUCOSE: 86 mg/dL (ref 70–99)
POTASSIUM: 3.6 meq/L (ref 3.5–5.1)
Sodium: 139 mEq/L (ref 135–145)

## 2014-02-19 LAB — CBC WITH DIFFERENTIAL/PLATELET
BASOS PCT: 0.9 % (ref 0.0–3.0)
Basophils Absolute: 0.1 10*3/uL (ref 0.0–0.1)
Eosinophils Absolute: 0.2 10*3/uL (ref 0.0–0.7)
Eosinophils Relative: 3.7 % (ref 0.0–5.0)
HCT: 44.2 % (ref 36.0–46.0)
HEMOGLOBIN: 15 g/dL (ref 12.0–15.0)
LYMPHS ABS: 1.9 10*3/uL (ref 0.7–4.0)
LYMPHS PCT: 30.5 % (ref 12.0–46.0)
MCHC: 34 g/dL (ref 30.0–36.0)
MCV: 85.8 fl (ref 78.0–100.0)
Monocytes Absolute: 0.4 10*3/uL (ref 0.1–1.0)
Monocytes Relative: 7.3 % (ref 3.0–12.0)
Neutro Abs: 3.5 10*3/uL (ref 1.4–7.7)
Neutrophils Relative %: 57.6 % (ref 43.0–77.0)
Platelets: 285 10*3/uL (ref 150.0–400.0)
RBC: 5.16 Mil/uL — ABNORMAL HIGH (ref 3.87–5.11)
RDW: 13.8 % (ref 11.5–15.5)
WBC: 6.1 10*3/uL (ref 4.0–10.5)

## 2014-02-23 ENCOUNTER — Ambulatory Visit (HOSPITAL_COMMUNITY): Payer: Medicare Other | Admitting: Anesthesiology

## 2014-02-23 ENCOUNTER — Encounter (HOSPITAL_COMMUNITY): Payer: Self-pay | Admitting: Certified Registered"

## 2014-02-23 ENCOUNTER — Encounter (HOSPITAL_COMMUNITY)
Admission: RE | Disposition: A | Discharge status: Home / Self Care | Payer: Self-pay | Source: Ambulatory Visit | Attending: Internal Medicine

## 2014-02-23 ENCOUNTER — Ambulatory Visit (HOSPITAL_COMMUNITY)
Admission: RE | Admit: 2014-02-23 | Discharge: 2014-02-23 | Disposition: A | Discharge status: Home / Self Care | Payer: Medicare Other | Source: Ambulatory Visit | Attending: Internal Medicine | Admitting: Internal Medicine

## 2014-02-23 DIAGNOSIS — Z7982 Long term (current) use of aspirin: Secondary | ICD-10-CM | POA: Diagnosis not present

## 2014-02-23 DIAGNOSIS — I10 Essential (primary) hypertension: Secondary | ICD-10-CM | POA: Insufficient documentation

## 2014-02-23 DIAGNOSIS — M13861 Other specified arthritis, right knee: Secondary | ICD-10-CM | POA: Diagnosis not present

## 2014-02-23 DIAGNOSIS — K219 Gastro-esophageal reflux disease without esophagitis: Secondary | ICD-10-CM | POA: Insufficient documentation

## 2014-02-23 DIAGNOSIS — E785 Hyperlipidemia, unspecified: Secondary | ICD-10-CM | POA: Diagnosis not present

## 2014-02-23 DIAGNOSIS — I471 Supraventricular tachycardia, unspecified: Secondary | ICD-10-CM | POA: Diagnosis present

## 2014-02-23 DIAGNOSIS — I495 Sick sinus syndrome: Secondary | ICD-10-CM | POA: Diagnosis present

## 2014-02-23 HISTORY — PX: SUPRAVENTRICULAR TACHYCARDIA ABLATION: SHX5492

## 2014-02-23 SURGERY — SUPRAVENTRICULAR TACHYCARDIA ABLATION
Anesthesia: Monitor Anesthesia Care

## 2014-02-23 MED ORDER — BUPIVACAINE HCL (PF) 0.25 % IJ SOLN
INTRAMUSCULAR | Status: AC
  Filled 2014-02-23: qty 30

## 2014-02-23 MED ORDER — HYDROCODONE-ACETAMINOPHEN 5-325 MG PO TABS: 1.0000 | ORAL_TABLET | ORAL | Status: DC | PRN

## 2014-02-23 MED ORDER — ONDANSETRON HCL 4 MG/2ML IJ SOLN
INTRAMUSCULAR | Status: DC | PRN
  Administered 2014-02-23: 4 mg via INTRAVENOUS

## 2014-02-23 MED ORDER — MIDAZOLAM HCL 5 MG/5ML IJ SOLN
INTRAMUSCULAR | Status: AC
  Filled 2014-02-23: qty 5

## 2014-02-23 MED ORDER — ACETAMINOPHEN 325 MG PO TABS: 650.0000 mg | ORAL_TABLET | ORAL | Status: DC | PRN

## 2014-02-23 MED ORDER — SODIUM CHLORIDE 0.9 % IJ SOLN: 3.0000 mL | INTRAMUSCULAR | Status: DC | PRN

## 2014-02-23 MED ORDER — MIDAZOLAM HCL 5 MG/5ML IJ SOLN
INTRAMUSCULAR | Status: DC | PRN
  Administered 2014-02-23 (×5): 0.5 mg via INTRAVENOUS

## 2014-02-23 MED ORDER — FENTANYL CITRATE 0.05 MG/ML IJ SOLN
INTRAMUSCULAR | Status: AC
  Filled 2014-02-23: qty 2

## 2014-02-23 MED ORDER — SODIUM CHLORIDE 0.9 % IV SOLN
INTRAVENOUS | Status: DC | PRN
  Administered 2014-02-23: 08:00:00 via INTRAVENOUS

## 2014-02-23 MED ORDER — SODIUM CHLORIDE 0.9 % IV SOLN: 250.0000 mL | INTRAVENOUS | Status: DC | PRN

## 2014-02-23 MED ORDER — SODIUM CHLORIDE 0.9 % IV SOLN
2.0000 ug/min | INTRAVENOUS | Status: AC
  Administered 2014-02-23: 2 ug via INTRAVENOUS
  Filled 2014-02-23: qty 2

## 2014-02-23 MED ORDER — SODIUM CHLORIDE 0.9 % IJ SOLN: 3.0000 mL | Freq: Two times a day (BID) | INTRAMUSCULAR | Status: DC

## 2014-02-23 MED ORDER — PROPOFOL INFUSION 10 MG/ML OPTIME
INTRAVENOUS | Status: DC | PRN
Start: 2014-02-23 — End: 2014-02-23
  Administered 2014-02-23: 25 ug/kg/min via INTRAVENOUS

## 2014-02-23 MED ORDER — FENTANYL CITRATE 0.05 MG/ML IJ SOLN
INTRAMUSCULAR | Status: DC | PRN
  Administered 2014-02-23 (×4): 25 ug via INTRAVENOUS

## 2014-02-23 MED ORDER — ONDANSETRON HCL 4 MG/2ML IJ SOLN: 4.0000 mg | Freq: Four times a day (QID) | INTRAMUSCULAR | Status: DC | PRN

## 2014-02-23 NOTE — Progress Notes (Signed)
UR Completed Mead Slane Graves-Bigelow, RN,BSN [phone]  

## 2014-02-23 NOTE — Progress Notes (Addendum)
Site area: lt groin Site Prior to Removal:  Level  0 Pressure Applied For: 15 minutes Manual:   yes Patient Status During Pull:  stable Post Pull Site:  Level  0 Post Pull Instructions Given:  yes Post Pull Pulses Present: yes Dressing Applied:  tegaderm Bedrest begins @  0354 Comments:  0 complications. IV saline locked.

## 2014-02-23 NOTE — Anesthesia Postprocedure Evaluation (Signed)
  Anesthesia Post-op Note  Patient: Anna Dawson  Procedure(s) Performed: Procedure(s): SUPRAVENTRICULAR TACHYCARDIA ABLATION (N/A)  Patient Location: Cath Lab  Anesthesia Type:MAC  Level of Consciousness: awake, alert , oriented and patient cooperative  Airway and Oxygen Therapy: Patient Spontanous Breathing  Post-op Pain: none  Post-op Assessment: Post-op Vital signs reviewed, Patient's Cardiovascular Status Stable, Respiratory Function Stable, Patent Airway, No signs of Nausea or vomiting and Pain level controlled  Post-op Vital Signs: Reviewed and stable  Last Vitals:  Filed Vitals:   02/23/14 1149  BP: 122/54  Pulse:   Temp:   Resp: 16    Complications: No apparent anesthesia complications

## 2014-02-23 NOTE — H&P (View-Only) (Signed)
Primary Care Physician: Vidal Schwalbe, MD Referring Physician:  Dr Ulis Rias is a 74 y.o. female with a h/o recently diagnosed sick sinus syndrome and also SVT who presents for EP consultation.  She reports having prior palpitations lasting 1-2 minutes with associated dizziness.  Rarely, this dizziness is associated with presyncope.  She has not had full syncope.  She is very active.  The patient underwent 48 hour Holter monitor that showed sinus pauses, mostly noctural.  There was a single pause at noon of 2.2 seconds for which she does not recall having symptoms.   She then had a 30 day monitor placed which documented SVT at 150 bpm.  She reports associated palpitations and dizziness with this.  She also had associated anxiety.  She has done well since, without any recurrent symptoms of dizziness or SVT.  Today, she denies symptoms of chest pain, shortness of breath, orthopnea, PND, lower extremity edema,   or neurologic sequela. The patient is tolerating medications without difficulties and is otherwise without complaint today.   Past Medical History  Diagnosis Date  . Seasonal allergies   . Arthritis     right knee  . Cataracts, bilateral 1994  . GERD (gastroesophageal reflux disease)   . Hyperlipidemia   . Hypertension   . SVT (supraventricular tachycardia)   . Sick sinus syndrome    Past Surgical History  Procedure Laterality Date  . Colonoscopy  2006  . Cataract extraction, bilateral  1994  . Retinal laser procedure  1995  . Knee arthroscopy  2011    RIGHT KNEE  . Tubal ligation    . Eyelid surgery  2006    BILATERAL    Current Outpatient Prescriptions  Medication Sig Dispense Refill  . aspirin 81 MG tablet Take 81 mg by mouth daily.    . benazepril-hydrochlorthiazide (LOTENSIN HCT) 20-12.5 MG per tablet Take 0.5 tablets by mouth daily.     . Cholecalciferol (VITAMIN D) 2000 UNITS CAPS Take 1 capsule by mouth daily.    . Glucosamine-Chondroitin  (GLUCOSAMINE CHONDR COMPLEX PO) Take 1 tablet by mouth daily.    . Multiple Vitamin (MULTIVITAMIN) tablet Take 1 tablet by mouth daily.    Marland Kitchen nystatin (NYSTOP) 100000 UNIT/GM POWD Apply 1 g topically daily as needed (itching).     . pravastatin (PRAVACHOL) 80 MG tablet Take 80 mg by mouth daily.      No current facility-administered medications for this visit.    Allergies  Allergen Reactions  . Amlodipine Besylate     Causes heart to race and swelling in both ankles     History   Social History  . Marital Status: Divorced    Spouse Name: N/A    Number of Children: N/A  . Years of Education: N/A   Occupational History  . Not on file.   Social History Main Topics  . Smoking status: Never Smoker   . Smokeless tobacco: Never Used  . Alcohol Use: No     Comment: occasional   . Drug Use: Not on file  . Sexual Activity: Not on file   Other Topics Concern  . Not on file   Social History Narrative   Lives along in [place].  Retired.    Family History  Problem Relation Age of Onset  . CVA Father   . Hypertension Father   . Hypercholesterolemia Father   . Colon cancer Mother   . Hypertension Mother   . Hypertension Sister   . Hypercholesterolemia  Sister   . Arrhythmia Sister   . Lung cancer Sister     non-smoker  . Mitral valve prolapse Sister     ROS- All systems are reviewed and negative except as per the HPI above  Physical Exam: Filed Vitals:   01/31/14 0839  BP: 138/86  Pulse: 71  Height: 5' 1.5" (1.562 m)  Weight: 163 lb (73.936 kg)    GEN- The patient is well appearing, alert and oriented x 3 today.   Head- normocephalic, atraumatic Eyes-  Sclera clear, conjunctiva pink Ears- hearing intact Oropharynx- clear Neck- supple, no JVP Lymph- no cervical lymphadenopathy Lungs- Clear to ausculation bilaterally, normal work of breathing Heart- Regular rate and rhythm, no murmurs, rubs or gallops, PMI not laterally displaced GI- soft, NT, ND, +  BS Extremities- no clubbing, cyanosis, or edema MS- no significant deformity or atrophy Skin- no rash or lesion Psych- euthymic mood, full affect Neuro- strength and sensation are intact  EKG today reveals sinus rhythm 71 bpm, PR 156, QRS 92, Qtc 439, low voltage, poor R wave progression holter and event monitor are reviewed Epic records are reviewed  Assessment and Plan:  1.  The patient has recurrent symptomatic short RP SVT at 156 bpm observed on event monitor with associated dizziness and palpitations.  Medical options are limited by bradycardia (see below).  Therapeutic strategies for supraventricular tachycardia including medicine and ablation were discussed in detail with the patient today. Risk, benefits, and alternatives to EP study and radiofrequency ablation were also discussed in detail today. These risks include but are not limited to stroke, bleeding, vascular damage, tamponade, perforation, damage to the heart and other structures, AV block requiring pacemaker, worsening renal function, and death. The patient understands these risk and wishes to proceed.  We will therefore proceed with catheter ablation at the next available time. We will schedule anesthesia for the case.  2. Sinus bradycardia She has had multiple episodes of sinus bradycardia documented, though most of these were nocturnal and asymptomatic on her holter.  She did not have significant brady events on her event monitor.  I would favor treatment of SVT (as above) with ablation and then follow bradycardia with "watchful waiting".  3. HTN Stable No change required today

## 2014-02-23 NOTE — Transfer of Care (Signed)
Immediate Anesthesia Transfer of Care Note  Patient: Anna Dawson  Procedure(s) Performed: Procedure(s): SUPRAVENTRICULAR TACHYCARDIA ABLATION (N/A)  Patient Location: PACU  Anesthesia Type:MAC  Level of Consciousness: awake and alert   Airway & Oxygen Therapy: Patient Spontanous Breathing and Patient connected to nasal cannula oxygen  Post-op Assessment: Report given to RN and Post -op Vital signs reviewed and stable  Post vital signs: Reviewed and stable  Last Vitals:  Filed Vitals:   02/23/14 0940  BP: 130/78  Pulse: 67  Temp:   Resp: 16    Complications: No apparent anesthesia complications

## 2014-02-23 NOTE — Op Note (Signed)
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 Comprehensive EP study.  2. Coronary sinus pacing and recording.  3. Mapping of supraventricular tachycardia.  4. Radiofrequency ablation of supraventricular tachycardia.  5. Arrhythmia induction with isuprel infused   INTRODUCTION: Anna Dawson is a 74 y.o. female with a history of symptomatic recurrent short RP SVT who presents today for EP study and radiofrequency ablation. The patient has had recurrent symptomatic SVT. Medical therapy is limited by sinus bradycardia.  She now presents for EP study and radiofrequency ablation of SVT.   DESCRIPTION OF PROCEDURE: Informed written consent was obtained and the patient was brought to the Electrophysiology Lab in the fasting state. The patient was adequately sedated with intravenous medication as outlined in the anesthesia report. The patient's bilateral groins were prepped and draped in the usual sterile fashion by the EP Lab staff. Using a percutaneous Seldinger technique, a 6 F hemostasis sheath was placed into the left femoral vein. A 7 F quadripolar catheter was advised through the left femoral vein into the right ventricle for pacing and recording. One 6, one 7 and one 8-French hemostasis sheaths were placed  into the right common femoral vein. One 6-French quadripolar Josephson catheter was introduced through the right common femoral vein and advanced into the His bundle position for recording and pacing.  A 61F decapolar catheter was advanced through the right femoral vein into the  coronary sinus position for recording and pacing.   Presenting Measurements: The patient presented to the Electrophysiology Lab in sinus rhythm. The PR interval was 176 msec with a QRS of 120 msec and a Qt of 410 msec. The average RR interval was 936 msec. The AH interval was 83 msec and the HV interval was 54 milliseconds.    EP study:  Ventricular pacing was performed which reveals midline concentric decremental VA conduction with no retrograde jumps, echo beats of tachycardias. The VA WCL was 380 msec and a VAERP 500/349msec.  Rapid atrial pacing was performed with reveals PR >> RR with tachycardia not induced at 370 msec. AEST was performed which revealed a prolonged AH jump with echo beats and easily inducible/ incesssant tachycardia observed.   At 500/300 msec the patient had an AH jump followed by sustained narrow complex SVT. The tachycardia cycle length was 346msec. The surface EKG was consistent with the patient's clinical tachycardia. VA time during tachycardia measured 52msec with earliest retrograde atrial activation recorded from the His electrogram. The tachycardia terminated spontaneously with a ventricular activation.  V pacing was performed during tachycardia which revealed a VAV response.  PVCs were delivered during His refractoriness which did not advance nor terminate the tachycardia.  The patient was therefore felt to have classic AV nodal reentrant tachycardia. I therefore elected to perform slow pathway modification.  Isuprel was discontinued and allowed to washout.   Ablation:  A 61F BW 23mm ablation catheter was therefore advanced through the right femoral vein and advanced into the right atrium. Mapping of Koch's triangle was performed which revealed a small sized triangle.  Three RF lesions were applied at sites  9-7 in Koch's triangle. Sustained accelerated junctional rhythm was observed during the third RF lesion which was delivered at 50 watts, 60 degrees, for 60 second duration.  Measurements following ablation:  Following ablation, Rapid atrial pacing was again performed with PR<RR and an AV WCL of 390 msec. AEST was performed which revealed a single AH  jump and single echo beat with tachycardia observed. The AVN ERP was 500/260 msec. V pacing was performed which revealed midline concentric  decremental VA conduction with no AH jumps, echo beats, or tachycardias. The VA WCL was 300 msec and a VERP 500/260msec.   Isuprel was infused at 2 mcg/min with an adequate acceleration in heart rate response. RAP revealed PR<RR with an AVWCL of 330 msec.  AEST was performed which revealed no AH jumps, echo beats, or tachycardias induced during isuprel infusion.   These maneuvers were repeated during isuprel washout with only an occasional jump with single echo beat observed.  Tachycardia was no longer inducible and there was no evidence of PR >RR post ablation.  No arrhythmias were induced. Following ablation the AH interval was 78 msec with an HV interval of 56 msec. The procedure was therefore considered completed. All catheters were removed and the sheaths were aspirated and flushed. The sheaths were removed and hemostasis was assured. EBL<31ml. There were no early apparent complications.   CONCLUSIONS:  1. Sinus rhythm upon presentation.  2. The patient had dual AV nodal physiology with easily inducible classic AV nodal reentrant tachycardia, there were no other accessory pathways or arrhythmias induced  3. Successful radiofrequency modification of the slow AV nodal pathway  4. No inducible arrhythmias following ablation.  5. No early apparent complications.   Thompson Grayer MD 02/23/2014 9:30 AM

## 2014-02-23 NOTE — Anesthesia Preprocedure Evaluation (Addendum)
Anesthesia Evaluation  Patient identified by MRN, date of birth, ID band Patient awake    Reviewed: Allergy & Precautions, NPO status , Patient's Chart, lab work & pertinent test results  History of Anesthesia Complications Negative for: history of anesthetic complications  Airway Mallampati: II  TM Distance: >3 FB Neck ROM: Full    Dental  (+) Chipped, Caps, Dental Advisory Given   Pulmonary neg pulmonary ROS,  breath sounds clear to auscultation        Cardiovascular hypertension, Pt. on medications + dysrhythmias Supra Ventricular Tachycardia Rhythm:Regular Rate:Normal  '15 ECHO: EF 55-60%, valves OK   Neuro/Psych negative neurological ROS     GI/Hepatic Neg liver ROS, GERD-  Controlled,  Endo/Other  negative endocrine ROS  Renal/GU negative Renal ROS     Musculoskeletal  (+) Arthritis -,   Abdominal   Peds  Hematology negative hematology ROS (+)   Anesthesia Other Findings   Reproductive/Obstetrics                           Anesthesia Physical Anesthesia Plan  ASA: III  Anesthesia Plan: MAC   Post-op Pain Management:    Induction: Intravenous  Airway Management Planned: Natural Airway and Simple Face Mask  Additional Equipment:   Intra-op Plan:   Post-operative Plan:   Informed Consent: I have reviewed the patients History and Physical, chart, labs and discussed the procedure including the risks, benefits and alternatives for the proposed anesthesia with the patient or authorized representative who has indicated his/her understanding and acceptance.   Dental advisory given  Plan Discussed with: CRNA and Surgeon  Anesthesia Plan Comments: (Plan routine monitors, MAC)       Anesthesia Quick Evaluation

## 2014-02-23 NOTE — Progress Notes (Signed)
Site area: rt groin Site Prior to Removal:  Level  0 Pressure Applied For:  20 minutes Manual:   yes Patient Status During Pull:  stable Post Pull Site:  Level  0 Post Pull Instructions Given:  yes Post Pull Pulses Present: yes Dressing Applied:  tegaderm Bedrest begins @  Comments:   

## 2014-02-23 NOTE — Progress Notes (Signed)
Doing well s/p ablation No concerns Exam is unchanged  DC to home Return to see me in 4 weeks She should contact our office if problems arise in the interim.  Thompson Grayer MD 02/23/2014 5:06 PM

## 2014-02-23 NOTE — Progress Notes (Signed)
Pt discharged home with friend.  Reviewed discharge instructions and education, all questions answered.  Assessment unchanged from earlier.  

## 2014-02-23 NOTE — Discharge Instructions (Signed)
No driving for 3 days. No lifting over 5 lbs for 1 week. No sexual activity for 1 week. You may return to work in 7 days. Keep procedure site clean & dry. If you notice increased pain, swelling, bleeding or pus, call/return!  You may shower, but no soaking baths/hot tubs/pools for 1 week.   Angiogram, Care After Refer to this sheet in the next few weeks. These instructions provide you with information on caring for yourself after your procedure. Your health care provider may also give you more specific instructions. Your treatment has been planned according to current medical practices, but problems sometimes occur. Call your health care provider if you have any problems or questions after your procedure.  WHAT TO EXPECT AFTER THE PROCEDURE After your procedure, it is typical to have the following sensations:  Minor discomfort or tenderness and a small bump at the catheter insertion site. The bump should usually decrease in size and tenderness within 1 to 2 weeks.  Any bruising will usually fade within 2 to 4 weeks. HOME CARE INSTRUCTIONS   You may need to keep taking blood thinners if they were prescribed for you. Take medicines only as directed by your health care provider.  Do not apply powder or lotion to the site.  Do not take baths, swim, or use a hot tub until your health care provider approves.  You may shower 24 hours after the procedure. Remove the bandage (dressing) and gently wash the site with plain soap and water. Gently pat the site dry.  Inspect the site at least twice daily.  Limit your activity for the first 48 hours. Do not bend, squat, or lift anything over 20 lb (9 kg) or as directed by your health care provider.  Plan to have someone take you home after the procedure. Follow instructions about when you can drive or return to work. SEEK MEDICAL CARE IF:  You get light-headed when standing up.  You have drainage (other than a small amount of blood on the  dressing).  You have chills.  You have a fever.  You have redness, warmth, swelling, or pain at the insertion site. SEEK IMMEDIATE MEDICAL CARE IF:   You develop chest pain or shortness of breath, feel faint, or pass out.  You have bleeding, swelling larger than a walnut, or drainage from the catheter insertion site.  You develop pain, discoloration, coldness, or severe bruising in the leg or arm that held the catheter.  You develop bleeding from any other place, such as the bowels. You may see bright red blood in your urine or stools, or your stools may appear black and tarry.  You have heavy bleeding from the site. If this happens, hold pressure on the site. MAKE SURE YOU:  Understand these instructions.  Will watch your condition.  Will get help right away if you are not doing well or get worse. Document Released: 07/31/2004 Document Revised: 05/29/2013 Document Reviewed: 06/06/2012 [facility] Patient Information 2015 [place], [place]. This information is not intended to replace advice given to you by your health care provider. Make sure you discuss any questions you have with your health care provider.

## 2014-02-23 NOTE — Interval H&P Note (Signed)
History and Physical Interval Note:  02/23/2014 7:08 AM  Anna Dawson  has presented today for surgery, with the diagnosis of svt  The various methods of treatment have been discussed with the patient and family. After consideration of risks, benefits and other options for treatment, the patient has consented to  Procedure(s): SUPRAVENTRICULAR TACHYCARDIA ABLATION (N/A) as a surgical intervention .  The patient's history has been reviewed, patient examined, no change in status, stable for surgery.  I have reviewed the patient's chart and labs.  Questions were answered to the patient's satisfaction.     Thompson Grayer

## 2014-04-02 ENCOUNTER — Ambulatory Visit (INDEPENDENT_AMBULATORY_CARE_PROVIDER_SITE_OTHER): Payer: Medicare Other | Admitting: Internal Medicine

## 2014-04-02 ENCOUNTER — Encounter: Payer: Self-pay | Admitting: Internal Medicine

## 2014-04-02 VITALS — BP 126/72 | HR 63 | Ht 61.5 in | Wt 163.8 lb

## 2014-04-02 DIAGNOSIS — I471 Supraventricular tachycardia: Secondary | ICD-10-CM

## 2014-04-02 DIAGNOSIS — I10 Essential (primary) hypertension: Secondary | ICD-10-CM

## 2014-04-02 DIAGNOSIS — I495 Sick sinus syndrome: Secondary | ICD-10-CM

## 2014-04-02 NOTE — Progress Notes (Signed)
Electrophysiology Office Note   Date:  04/02/2014   ID:  Anna Dawson, DOB August 29, 1940, MRN [id number]  PCP:  Vidal Schwalbe, MD  Cardiologist:  Dr Meda Coffee Primary Electrophysiologist: Thompson Grayer, MD    Chief Complaint  Patient presents with  . Follow-up    SVT     History of Present Illness: Anna Dawson is a 74 y.o. female who presents today for electrophysiology evaluation.   Since her recent SVT ablation, she has done well.  She denies procedure related complications.  She has had no further SVT and is pleased with her result.  She denies any symptoms of bradycardia.   Today, she denies symptoms of palpitations, chest pain, shortness of breath, orthopnea, PND, lower extremity edema, claudication, dizziness, presyncope, syncope, bleeding, or neurologic sequela. The patient is tolerating medications without difficulties and is otherwise without complaint today.    Past Medical History  Diagnosis Date  . Seasonal allergies   . Arthritis     right knee  . Cataracts, bilateral 1994  . GERD (gastroesophageal reflux disease)   . Hyperlipidemia   . Hypertension   . SVT (supraventricular tachycardia)   . Sick sinus syndrome    Past Surgical History  Procedure Laterality Date  . Colonoscopy  2006  . Cataract extraction, bilateral  1994  . Retinal laser procedure  1995  . Knee arthroscopy  2011    RIGHT KNEE  . Tubal ligation    . Eyelid surgery  2006    BILATERAL  . Supraventricular tachycardia ablation N/A 02/23/2014    Procedure: SUPRAVENTRICULAR TACHYCARDIA ABLATION;  Surgeon: Thompson Grayer, MD;  Location: [facility] CATH LAB;  Service: Cardiovascular;  Laterality: N/A;     Current Outpatient Prescriptions  Medication Sig Dispense Refill  . benazepril-hydrochlorthiazide (LOTENSIN HCT) 20-12.5 MG per tablet Take 0.5 tablets by mouth daily.     . Cholecalciferol (VITAMIN D) 2000 UNITS CAPS Take 1 capsule by mouth daily.    . Glucosamine-Chondroitin (GLUCOSAMINE CHONDR  COMPLEX PO) Take 1 tablet by mouth daily.    . Multiple Vitamin (MULTIVITAMIN) tablet Take 1 tablet by mouth daily.    Marland Kitchen nystatin (NYSTOP) 100000 UNIT/GM POWD Apply 1 g topically daily as needed (itching).     Marland Kitchen OVER THE COUNTER MEDICATION Apply 1 application topically daily as needed (stress relief/ sleep). Essential Oil    . pravastatin (PRAVACHOL) 80 MG tablet Take 80 mg by mouth daily.      No current facility-administered medications for this visit.    Allergies:   Amlodipine besylate   Social History:  The patient  reports that she has never smoked. She has never used smokeless tobacco. She reports that she does not drink alcohol.   Family History:  The patient's  family history includes Arrhythmia in her sister; CVA in her father; Colon cancer in her mother; Hypercholesterolemia in her father and sister; Hypertension in her father, mother, and sister; Lung cancer in her sister; Mitral valve prolapse in her sister.    ROS:  Please see the history of present illness.   All other systems are reviewed and negative.    PHYSICAL EXAM: VS:  BP 126/72 mmHg  Pulse 63  Ht 5' 1.5" (1.562 m)  Wt 163 lb 12.8 oz (74.299 kg)  BMI 30.45 kg/m2 , BMI Body mass index is 30.45 kg/(m^2). GEN: Well nourished, well developed, in no acute distress HEENT: normal Neck: no JVD, carotid bruits, or masses Cardiac: RRR; no murmurs, rubs, or gallops,no edema  Respiratory:  clear to auscultation bilaterally, normal work of breathing GI: soft, nontender, nondistended, + BS MS: no deformity or atrophy Skin: warm and dry  Neuro:  Strength and sensation are intact Psych: euthymic mood, full affect  EKG:  EKG is ordered today. The ekg ordered today shows sinus rhythm 63 bpm, PR 164, poor R wave progression   Recent Labs: 02/19/2014: BUN 15; Creatinine 0.70; Hemoglobin 15.0; Platelets 285.0; Potassium 3.6; Sodium 139    Lipid Panel  No results found for: CHOL, TRIG, HDL, CHOLHDL, VLDL, LDLCALC,  LDLDIRECT   Wt Readings from Last 3 Encounters:  04/02/14 163 lb 12.8 oz (74.299 kg)  02/23/14 163 lb (73.936 kg)  01/31/14 163 lb (73.936 kg)    ASSESSMENT AND PLAN:  1.  SVT Resolved s/p ablation of AVNRT  2. bradycardia Asymptomatic No indication for pacing  3. HTN Stable No change required today  Follow-up with Dr Meda Coffee as scheduled.  I will see as needed going forward.   Army Fossa, MD  04/02/2014 10:12 PM     [address] [place] [address] [phone] (office) [phone] (fax)

## 2014-04-02 NOTE — Patient Instructions (Addendum)
Your physician recommends that you schedule a follow-up appointment as needed  

## 2014-07-11 ENCOUNTER — Ambulatory Visit (INDEPENDENT_AMBULATORY_CARE_PROVIDER_SITE_OTHER): Payer: Medicare Other | Admitting: Cardiology

## 2014-07-11 ENCOUNTER — Encounter: Payer: Self-pay | Admitting: Cardiology

## 2014-07-11 VITALS — BP 124/64 | HR 65 | Ht 61.5 in | Wt 163.0 lb

## 2014-07-11 DIAGNOSIS — I270 Primary pulmonary hypertension: Secondary | ICD-10-CM | POA: Diagnosis not present

## 2014-07-11 DIAGNOSIS — I10 Essential (primary) hypertension: Secondary | ICD-10-CM

## 2014-07-11 DIAGNOSIS — E785 Hyperlipidemia, unspecified: Secondary | ICD-10-CM

## 2014-07-11 DIAGNOSIS — I272 Pulmonary hypertension, unspecified: Secondary | ICD-10-CM

## 2014-07-11 DIAGNOSIS — I4719 Other supraventricular tachycardia: Secondary | ICD-10-CM

## 2014-07-11 DIAGNOSIS — I471 Supraventricular tachycardia, unspecified: Secondary | ICD-10-CM

## 2014-07-11 NOTE — Patient Instructions (Signed)
Medication Instructions:   Your physician recommends that you continue on your current medications as directed. Please refer to the Current Medication list given to you today.     Testing/Procedures:  Your physician has requested that you have an echocardiogram. Echocardiography is a painless test that uses sound waves to create images of your heart. It provides your doctor with information about the size and shape of your heart and how well your heart's chambers and valves are working. This procedure takes approximately one hour. There are no restrictions for this procedure.    Follow-Up:  Your physician wants you to follow-up in: [place] will receive a reminder letter in the mail two months in advance. If you don't receive a letter, please call our office to schedule the follow-up appointment.

## 2014-07-11 NOTE — Addendum Note (Signed)
Addended by: Nuala Alpha on: 07/11/2014 09:43 AM   Modules accepted: Orders

## 2014-07-11 NOTE — Progress Notes (Signed)
Patient ID: Anna Dawson, female   DOB: [birth year], 74 y.o.   MRN: [id number]       Electrophysiology Office Note   Date:  07/11/2014   ID:  Anna Dawson, DOB January 22, 1941, MRN [id number]  PCP:  Vidal Schwalbe, MD  Cardiologist:  Dr Meda Coffee Primary Electrophysiologist: Dorothy Spark, MD    No chief complaint on file.    History of Present Illness: Anna Dawson is a 74 y.o. female who presents today for electrophysiology evaluation.   Since her recent SVT ablation, she has done well.  She denies procedure related complications.  She has had no further SVT and is pleased with her result.  She denies any symptoms of bradycardia.  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 She doesn't exercise but can perform all the activities of daily living without limitations.  Denies chest pain, orthopnea, PND.    Past Medical History  Diagnosis Date  . Seasonal allergies   . Arthritis     right knee  . Cataracts, bilateral 1994  . GERD (gastroesophageal reflux disease)   . Hyperlipidemia   . Hypertension   . SVT (supraventricular tachycardia)   . Sick sinus syndrome    Past Surgical History  Procedure Laterality Date  . Colonoscopy  2006  . Cataract extraction, bilateral  1994  . Retinal laser procedure  1995  . Knee arthroscopy  2011    RIGHT KNEE  . Tubal ligation    . Eyelid surgery  2006    BILATERAL  . Supraventricular tachycardia ablation N/A 02/23/2014    Procedure: SUPRAVENTRICULAR TACHYCARDIA ABLATION;  Surgeon: Thompson Grayer, MD;  Location: [facility] C[facility] LAB;  Service: Cardiovascular;  Laterality: N/A;     Current Outpatient Prescriptions  Medication Sig Dispense Refill  . benazepril-hydrochlorthiazide (LOTENSIN HCT) 20-12.5 MG per tablet Take 0.5 tablets by mouth 2 (two) times daily.     . Cholecalciferol (VITAMIN D) 2000 UNITS CAPS Take 1 capsule by  mouth daily.    . Glucosamine-Chondroitin (GLUCOSAMINE CHONDR COMPLEX PO) Take 1 tablet by mouth daily.    . Multiple Vitamin (MULTIVITAMIN) tablet Take 1 tablet by mouth daily.    Marland Kitchen nystatin (NYSTOP) 100000 UNIT/GM POWD Apply 1 g topically daily as needed (itching).     Marland Kitchen OVER THE COUNTER MEDICATION Apply 1 application topically daily as needed (stress relief/ sleep). Essential Oil    . pravastatin (PRAVACHOL) 80 MG tablet Take 80 mg by mouth daily.      No current facility-administered medications for this visit.    Allergies:   Amlodipine besylate   Social History:  The patient  reports that she has never smoked. She has never used smokeless tobacco. She reports that she does not drink alcohol.   Family History:  The patient's  family history includes Arrhythmia in her sister; CVA in her father; Colon cancer in her mother; Hypercholesterolemia in her father and sister; Hypertension in her father, mother, and sister; Lung cancer in her sister; Mitral valve prolapse in her sister.    ROS:  Please see the history of present illness.   All other systems are reviewed and negative.    PHYSICAL EXAM: VS:  BP 124/64 mmHg  Pulse 65  Ht 5' 1.5" (1.562 m)  Wt 163 lb (73.936 kg)  BMI 30.30 kg/m2 , BMI Body mass index is 30.3 kg/(m^2). GEN: Well nourished, well developed, in  no acute distress HEENT: normal Neck: no JVD, carotid bruits, or masses Cardiac: RRR; no murmurs, rubs, or gallops,no edema  Respiratory:  clear to auscultation bilaterally, normal work of breathing GI: soft, nontender, nondistended, + BS MS: no deformity or atrophy Skin: warm and dry  Neuro:  Strength and sensation are intact Psych: euthymic mood, full affect  EKG:  EKG is ordered today. The ekg ordered today shows sinus rhythm 63 bpm, PR 164, poor R wave progression  Recent Labs: 02/19/2014: BUN 15; Creatinine, Ser 0.70; Hemoglobin 15.0; Platelets 285.0; Potassium 3.6; Sodium 139   Lipid Panel  No results found  for: CHOL, TRIG, HDL, CHOLHDL, VLDL, LDLCALC, LDLDIRECT  Wt Readings from Last 3 Encounters:  07/11/14 163 lb (73.936 kg)  04/02/14 163 lb 12.8 oz (74.299 kg)  02/23/14 163 lb (73.936 kg)   Study Conclusions  - Left ventricle: The cavity size was normal. Wall thickness was normal. Systolic function was normal. The estimated ejection fraction was in the range of 55% to 60%. Wall motion was normal; there were no regional wall motion abnormalities. Doppler parameters are consistent with abnormal left ventricular relaxation (grade 1 diastolic dysfunction). The E/e&' ratio is between 8-15, suggesting indeterminate LV filling pressure. - Mitral valve: Calcified annulus. There was trivial regurgitation. - Left atrium: The atrium was normal in size. - Tricuspid valve: There was mild regurgitation. - Pulmonary arteries: PA peak pressure: 40 mm Hg (S).  Impressions:  - LVEF 55-60%, normal wall thickness, diastolic dysfunction, indeterminate LV filling pressure, normal LA size, mild TR, RVSP 40 mmHg.    ASSESSMENT AND PLAN:  1.  SVT Resolved s/p ablation of AVNRT, asymptomatic  2. Bradycardia Asymptomatic No indication for pacing  3. HTN Stable No change required today  4. Pulmonary HTN - we will recheck echo, unless worsening we will continue teh same meds as she doesn't wish to change meds.  5. Hyperlipidemia - followed by PCP, on pravastatin 80 mg po daily.  Follow up in 6 months.  Dorothy Spark, MD, [facility] 07/11/2014

## 2014-08-29 ENCOUNTER — Ambulatory Visit (HOSPITAL_COMMUNITY): Payer: Medicare Other | Attending: Cardiology

## 2014-08-29 ENCOUNTER — Other Ambulatory Visit: Payer: Self-pay

## 2014-08-29 DIAGNOSIS — I071 Rheumatic tricuspid insufficiency: Secondary | ICD-10-CM | POA: Diagnosis not present

## 2014-08-29 DIAGNOSIS — E785 Hyperlipidemia, unspecified: Secondary | ICD-10-CM | POA: Insufficient documentation

## 2014-08-29 DIAGNOSIS — I10 Essential (primary) hypertension: Secondary | ICD-10-CM | POA: Diagnosis not present

## 2014-08-29 DIAGNOSIS — I272 Pulmonary hypertension, unspecified: Secondary | ICD-10-CM

## 2014-08-29 DIAGNOSIS — I270 Primary pulmonary hypertension: Secondary | ICD-10-CM | POA: Diagnosis not present

## 2014-08-29 DIAGNOSIS — I471 Supraventricular tachycardia: Secondary | ICD-10-CM | POA: Diagnosis not present

## 2014-08-29 DIAGNOSIS — I517 Cardiomegaly: Secondary | ICD-10-CM | POA: Diagnosis not present

## 2014-08-29 DIAGNOSIS — I340 Nonrheumatic mitral (valve) insufficiency: Secondary | ICD-10-CM | POA: Insufficient documentation

## 2014-08-29 DIAGNOSIS — I371 Nonrheumatic pulmonary valve insufficiency: Secondary | ICD-10-CM | POA: Insufficient documentation

## 2015-02-25 ENCOUNTER — Encounter: Payer: Self-pay | Admitting: Cardiology

## 2015-02-25 ENCOUNTER — Ambulatory Visit (INDEPENDENT_AMBULATORY_CARE_PROVIDER_SITE_OTHER): Payer: Medicare Other | Admitting: Cardiology

## 2015-02-25 VITALS — BP 124/76 | HR 65 | Ht 61.5 in | Wt 164.0 lb

## 2015-02-25 DIAGNOSIS — I272 Other secondary pulmonary hypertension: Secondary | ICD-10-CM | POA: Diagnosis not present

## 2015-02-25 DIAGNOSIS — I471 Supraventricular tachycardia: Secondary | ICD-10-CM

## 2015-02-25 DIAGNOSIS — I495 Sick sinus syndrome: Secondary | ICD-10-CM

## 2015-02-25 NOTE — Progress Notes (Signed)
Patient ID: Anna Dawson, female   DOB: 03-Aug-1940, 75 y.o.   MRN: [id number]     Cardiology Office Note  Date:  02/25/2015   ID:  Anna Dawson, DOB 1940-08-02, MRN [id number]  PCP:  Vidal Schwalbe, MD  Cardiologist:  Dr Meda Coffee Primary Electrophysiologist: Dorothy Spark, MD    Chief complain:    History of Present Illness: [name] is a 75 y.o. 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 She denies procedure related complications.  She has had no further SVT and is pleased with her result.  She denies any symptoms of bradycardia. She stays active and is asymptomatic, denies palpitations, syncope, chest pain, orthopnea, PND.   Past Medical History  Diagnosis Date  . Seasonal allergies   . Arthritis     right knee  . Cataracts, bilateral 1994  . GERD (gastroesophageal reflux disease)   . Hyperlipidemia   . Hypertension   . SVT (supraventricular tachycardia) ([place])   . Sick sinus syndrome [facility])    Past Surgical History  Procedure Laterality Date  . Colonoscopy  2006  . Cataract extraction, bilateral  1994  . Retinal laser procedure  1995  . Knee arthroscopy  2011    RIGHT KNEE  . Tubal ligation    . Eyelid surgery  2006    BILATERAL  . Supraventricular tachycardia ablation N/A 02/23/2014    Procedure: SUPRAVENTRICULAR TACHYCARDIA ABLATION;  Surgeon: Thompson Grayer, MD;  Location: [facility] CATH LAB;  Service: Cardiovascular;  Laterality: N/A;   Current Outpatient Prescriptions  Medication Sig Dispense Refill  . benazepril-hydrochlorthiazide (LOTENSIN HCT) 20-12.5 MG per tablet Take 0.5 tablets by mouth 2 (two) times daily.     . Cholecalciferol (VITAMIN D) 2000 UNITS CAPS Take 1 capsule by mouth daily.    . Glucosamine-Chondroitin (GLUCOSAMINE CHONDR COMPLEX PO) Take 1 tablet by mouth daily.    . Multiple Vitamin (MULTIVITAMIN) tablet Take 1 tablet by mouth daily.    Marland Kitchen nystatin  (NYSTOP) 100000 UNIT/GM POWD Apply 1 g topically daily as needed (itching).     Marland Kitchen OVER THE COUNTER MEDICATION Apply 1 application topically daily as needed (stress relief/ sleep). Essential Oil    . pravastatin (PRAVACHOL) 80 MG tablet Take 80 mg by mouth daily.      No current facility-administered medications for this visit.   Allergies:   Amlodipine besylate   Social History:  The patient  reports that she has never smoked. She has never used smokeless tobacco. She reports that she does not drink alcohol.   Family History:  The patient's  family history includes Arrhythmia in her sister; CVA in her father; Colon cancer in her mother; Hypercholesterolemia in her father and sister; Hypertension in her father, mother, and sister; Lung cancer in her sister; Mitral valve prolapse in her sister.   ROS:  Please see the history of present illness.   All other systems are reviewed and negative.   PHYSICAL EXAM: VS:  BP 124/76 mmHg  Pulse 65  Ht 5' 1.5" (1.562 m)  Wt 164 lb (74.39 kg)  BMI 30.49 kg/m2 , BMI Body mass index is 30.49 kg/(m^2). GEN: Well nourished, well developed, in no acute distress HEENT: normal Neck: no JVD, carotid bruits, or masses Cardiac: RRR; no murmurs, rubs, or gallops,no edema  Respiratory:  clear to auscultation bilaterally, normal work of breathing GI: soft, nontender, nondistended, + BS MS: no deformity or atrophy  Skin: warm and dry  Neuro:  Strength and sensation are intact Psych: euthymic mood, full affect  EKG:  EKG is ordered today. The ekg ordered today shows sinus rhythm 63 bpm, PR 164, poor R wave progression  Lipid Panel  No results found for: CHOL, TRIG, HDL, CHOLHDL, VLDL, LDLCALC, LDLDIRECT  Wt Readings from Last 3 Encounters:  02/25/15 164 lb (74.39 kg)  07/11/14 163 lb (73.936 kg)  04/02/14 163 lb 12.8 oz (74.299 kg)   Study Conclusions  - Left ventricle: The cavity size was normal. Wall thickness was normal. Systolic function was  normal. The estimated ejection fraction was in the range of 55% to 60%. Wall motion was normal; there were no regional wall motion abnormalities. Doppler parameters are consistent with abnormal left ventricular relaxation (grade 1 diastolic dysfunction). The E/e&' ratio is between 8-15, suggesting indeterminate LV filling pressure. - Mitral valve: Calcified annulus. There was trivial regurgitation. - Left atrium: The atrium was normal in size. - Tricuspid valve: There was mild regurgitation. - Pulmonary arteries: PA peak pressure: 40 mm Hg (S).  Impressions: - LVEF 55-60%, normal wall thickness, diastolic dysfunction, indeterminate LV filling pressure, normal LA size, mild TR, RVSP 40 mmHg.    ASSESSMENT AND PLAN:  1.  SVT Resolved s/p ablation of AVNRT, asymptomatic  2. Bradycardia Asymptomatic No indication for pacing  3. HTN Stable No change required today  4. Pulmonary HTN - rechecked echo in august 2016 showed PASP 36 mmHg.  5. Hyperlipidemia - followed by PCP, on pravastatin 80 mg po daily.  Follow up in 1 year  Dorothy Spark, MD, [facility] 02/25/2015

## 2015-02-25 NOTE — Patient Instructions (Signed)
**Note De-identified Kenslei Hearty Obfuscation** Medication Instructions:  Same-no changes  Labwork: None  Testing/Procedures: None  Follow-Up: Your physician wants you to follow-up in: 1 year. You will receive a reminder letter in the mail two months in advance. If you don't receive a letter, please call our office to schedule the follow-up appointment.      If you need a refill on your cardiac medications before your next appointment, please call your pharmacy.   

## 2015-04-05 DIAGNOSIS — Z961 Presence of intraocular lens: Secondary | ICD-10-CM | POA: Diagnosis not present

## 2015-04-05 DIAGNOSIS — H524 Presbyopia: Secondary | ICD-10-CM | POA: Diagnosis not present

## 2015-04-05 DIAGNOSIS — H35341 Macular cyst, hole, or pseudohole, right eye: Secondary | ICD-10-CM | POA: Diagnosis not present

## 2015-04-05 DIAGNOSIS — H5213 Myopia, bilateral: Secondary | ICD-10-CM | POA: Diagnosis not present

## 2015-04-05 DIAGNOSIS — H52223 Regular astigmatism, bilateral: Secondary | ICD-10-CM | POA: Diagnosis not present

## 2015-04-05 DIAGNOSIS — D2311 Other benign neoplasm of skin of right eyelid, including canthus: Secondary | ICD-10-CM | POA: Diagnosis not present

## 2015-04-05 DIAGNOSIS — Z9849 Cataract extraction status, unspecified eye: Secondary | ICD-10-CM | POA: Diagnosis not present

## 2015-05-15 DIAGNOSIS — B372 Candidiasis of skin and nail: Secondary | ICD-10-CM | POA: Diagnosis not present

## 2015-05-15 DIAGNOSIS — F439 Reaction to severe stress, unspecified: Secondary | ICD-10-CM | POA: Diagnosis not present

## 2015-05-15 DIAGNOSIS — I10 Essential (primary) hypertension: Secondary | ICD-10-CM | POA: Diagnosis not present

## 2015-05-15 DIAGNOSIS — E785 Hyperlipidemia, unspecified: Secondary | ICD-10-CM | POA: Diagnosis not present

## 2015-06-13 DIAGNOSIS — J069 Acute upper respiratory infection, unspecified: Secondary | ICD-10-CM | POA: Diagnosis not present

## 2015-10-25 DIAGNOSIS — Z1231 Encounter for screening mammogram for malignant neoplasm of breast: Secondary | ICD-10-CM | POA: Diagnosis not present

## 2015-12-17 ENCOUNTER — Encounter: Payer: Self-pay | Admitting: Cardiology

## 2015-12-17 DIAGNOSIS — Z23 Encounter for immunization: Secondary | ICD-10-CM | POA: Diagnosis not present

## 2015-12-17 DIAGNOSIS — Z1389 Encounter for screening for other disorder: Secondary | ICD-10-CM | POA: Diagnosis not present

## 2015-12-17 DIAGNOSIS — Z0000 Encounter for general adult medical examination without abnormal findings: Secondary | ICD-10-CM | POA: Diagnosis not present

## 2015-12-17 DIAGNOSIS — E559 Vitamin D deficiency, unspecified: Secondary | ICD-10-CM | POA: Diagnosis not present

## 2015-12-17 DIAGNOSIS — E785 Hyperlipidemia, unspecified: Secondary | ICD-10-CM | POA: Diagnosis not present

## 2015-12-17 DIAGNOSIS — I10 Essential (primary) hypertension: Secondary | ICD-10-CM | POA: Diagnosis not present

## 2015-12-17 DIAGNOSIS — N898 Other specified noninflammatory disorders of vagina: Secondary | ICD-10-CM | POA: Diagnosis not present

## 2016-01-08 DIAGNOSIS — N8110 Cystocele, unspecified: Secondary | ICD-10-CM | POA: Diagnosis not present

## 2016-01-08 DIAGNOSIS — N362 Urethral caruncle: Secondary | ICD-10-CM | POA: Diagnosis not present

## 2016-02-18 NOTE — Progress Notes (Signed)
Patient ID: Anna Dawson, female   DOB: 04-08-1940, 76 y.o.   MRN: [id number]     Cardiology Office Note  Date:  02/19/2016   ID:  Anna Dawson, DOB 1940/09/16, MRN [id number]  PCP:  Vidal Schwalbe, MD  Cardiologist:  Dr Meda Coffee Primary Electrophysiologist: Ena Dawley, MD    Chief complain: 1 year follow up post SVT ablation   History of Present Illness: Anna Dawson is a 76 y.o. 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 She denies procedure related complications.  She has had no further SVT and is pleased with her result.  She denies any symptoms of bradycardia. She stays active and is asymptomatic, denies palpitations, syncope, chest pain, orthopnea, PND.   02/19/2016 - 1 year follow up, the patient denies any further palpitations. She remains active and denies any dizziness, presyncope or syncope. She denies any chest pain or shortness of breath. She has been compliant with her meds with no side effects. She follows with Dr. Harlan Stains at [place] and has had her blood work checked last months.  Past Medical History:  Diagnosis Date  . Arthritis    right knee  . Cataracts, bilateral 1994  . GERD (gastroesophageal reflux disease)   . Hyperlipidemia   . Hypertension   . Seasonal allergies   . Sick sinus syndrome ([place])   . SVT (supraventricular tachycardia) ([facility])    Past Surgical History:  Procedure Laterality Date  . CATARACT EXTRACTION, BILATERAL  1994  . COLONOSCOPY  2006  . EYELID SURGERY  2006   BILATERAL  . KNEE ARTHROSCOPY  2011   RIGHT KNEE  . RETINAL LASER PROCEDURE  1995  . SUPRAVENTRICULAR TACHYCARDIA ABLATION N/A 02/23/2014   Procedure: SUPRAVENTRICULAR TACHYCARDIA ABLATION;  Surgeon: Thompson Grayer, MD;  Location: [facility] CATH LAB;  Service: Cardiovascular;  Laterality: N/A;  . TUBAL LIGATION     Current Outpatient Prescriptions  Medication Sig Dispense Refill  .  benazepril-hydrochlorthiazide (LOTENSIN HCT) 20-12.5 MG per tablet Take 0.5 tablets by mouth 2 (two) times daily.     . Cholecalciferol (VITAMIN D) 2000 UNITS CAPS Take 1 capsule by mouth daily.    Marland Kitchen ESTRACE VAGINAL 0.1 MG/GM vaginal cream Place vaginally 2 (two) times a week.  5  . Glucosamine-Chondroitin (GLUCOSAMINE CHONDR COMPLEX PO) Take 1 tablet by mouth daily.    . Multiple Vitamin (MULTIVITAMIN) tablet Take 1 tablet by mouth daily.    Marland Kitchen nystatin (NYSTOP) 100000 UNIT/GM POWD Apply 1 g topically daily as needed (itching).     Marland Kitchen OVER THE COUNTER MEDICATION Apply 1 application topically daily as needed (stress relief/ sleep). Essential Oil    . pravastatin (PRAVACHOL) 80 MG tablet Take 80 mg by mouth daily.     Marland Kitchen aspirin EC 81 MG tablet Take 1 tablet (81 mg total) by mouth daily. 90 tablet 3   No current facility-administered medications for this visit.    Allergies:   Amlodipine besylate   Social History:  The patient  reports that she has never smoked. She has never used smokeless tobacco. She reports that she does not drink alcohol.   Family History:  The patient's  family history includes Arrhythmia in her sister; CVA in her father; Colon cancer in her mother; Hypercholesterolemia in her father and sister; Hypertension in her father, mother, and sister; Lung cancer in her sister; Mitral valve prolapse in her sister.   ROS:  Please see  the history of present illness.   All other systems are reviewed and negative.   PHYSICAL EXAM: VS:  BP 124/72   Pulse 72   Ht 5' 1.5" (1.562 m)   Wt 163 lb (73.9 kg)   BMI 30.30 kg/m  , BMI Body mass index is 30.3 kg/m. GEN: Well nourished, well developed, in no acute distress  HEENT: normal  Neck: no JVD, carotid bruits, or masses Cardiac: RRR; no murmurs, rubs, or gallops,no edema  Respiratory:  clear to auscultation bilaterally, normal work of breathing GI: soft, nontender, nondistended, + BS MS: no deformity or atrophy  Skin: warm and dry   Neuro:  Strength and sensation are intact Psych: euthymic mood, full affect  EKG:  EKG is ordered today. The ekg ordered today shows sinus rhythm 63 bpm, PR 164, poor R wave progression  Lipid Panel  No results found for: CHOL, TRIG, HDL, CHOLHDL, VLDL, LDLCALC, LDLDIRECT  Wt Readings from Last 3 Encounters:  02/19/16 163 lb (73.9 kg)  02/25/15 164 lb (74.4 kg)  07/11/14 163 lb (73.9 kg)   TTE: 08/2014  - Left ventricle: The cavity size was normal. Wall thickness was normal. Systolic function was normal. The estimated ejection fraction was in the range of 55% to 60%. Wall motion was normal; there were no regional wall motion abnormalities. Doppler parameters are consistent with abnormal left ventricular relaxation (grade 1 diastolic dysfunction). The E/e&' ratio is between 8-15, suggesting indeterminate LV filling pressure. - Mitral valve: Calcified annulus. There was trivial regurgitation. - Left atrium: The atrium was normal in size. - Tricuspid valve: There was mild regurgitation. - Pulmonary arteries: PA peak pressure: 40 mm Hg (S).  Impressions: - LVEF 55-60%, normal wall thickness, diastolic dysfunction, indeterminate LV filling pressure, normal LA size, mild TR, RVSP 40 mmHg.  ECG: Sinus rhythm, low-voltage QRS, otherwise unchanged from prior.    ASSESSMENT AND PLAN:  1.  SVT Resolved s/p ablation of AVNRT, asymptomatic  2. Bradycardia Asymptomatic , today's heart rate 72 bpm, she is asymptomatic and advised to call us with any signs of dizziness presyncope or syncope. No indication for pacing. On Holter monitor 2 years ago she had 8 episodes of pauses over 2 seconds long with the longest lasting 2.1 seconds.  3. HTN Stable No change required today  4. Pulmonary HTN - rechecked echo in august 2016 showed PASP 36 mmHg. she is asymptomatic.  5. Hyperlipidemia - followed by PCP, on pravastatin 80 mg po daily. Requests labs. Follow up in 1  year  Dorothy Spark, MD, [facility] 02/19/2016

## 2016-02-19 ENCOUNTER — Ambulatory Visit (INDEPENDENT_AMBULATORY_CARE_PROVIDER_SITE_OTHER): Payer: Medicare Other | Admitting: Cardiology

## 2016-02-19 VITALS — BP 124/72 | HR 72 | Ht 61.5 in | Wt 163.0 lb

## 2016-02-19 DIAGNOSIS — I10 Essential (primary) hypertension: Secondary | ICD-10-CM

## 2016-02-19 DIAGNOSIS — I471 Supraventricular tachycardia: Secondary | ICD-10-CM | POA: Diagnosis not present

## 2016-02-19 DIAGNOSIS — E785 Hyperlipidemia, unspecified: Secondary | ICD-10-CM | POA: Diagnosis not present

## 2016-02-19 DIAGNOSIS — I495 Sick sinus syndrome: Secondary | ICD-10-CM | POA: Diagnosis not present

## 2016-02-19 MED ORDER — ASPIRIN EC 81 MG PO TBEC: 81.0000 mg | DELAYED_RELEASE_TABLET | Freq: Every day | ORAL | 3 refills | Status: DC

## 2016-02-19 NOTE — Patient Instructions (Signed)
Your physician has recommended you make the following change in your medication:  1.) start aspirin 81 mg (baby aspirin-coated) once a day  Your physician wants you to follow-up in: 1 year with Dr. Meda Coffee.  You will receive a reminder letter in the mail two months in advance. If you don't receive a letter, please call our office to schedule the follow-up appointment.

## 2016-03-23 DIAGNOSIS — L821 Other seborrheic keratosis: Secondary | ICD-10-CM | POA: Diagnosis not present

## 2016-03-23 DIAGNOSIS — D1800 Hemangioma unspecified site: Secondary | ICD-10-CM | POA: Diagnosis not present

## 2016-03-23 DIAGNOSIS — Z86018 Personal history of other benign neoplasm: Secondary | ICD-10-CM | POA: Diagnosis not present

## 2016-03-23 DIAGNOSIS — L814 Other melanin hyperpigmentation: Secondary | ICD-10-CM | POA: Diagnosis not present

## 2016-04-14 DIAGNOSIS — H5213 Myopia, bilateral: Secondary | ICD-10-CM | POA: Diagnosis not present

## 2016-04-14 DIAGNOSIS — H52223 Regular astigmatism, bilateral: Secondary | ICD-10-CM | POA: Diagnosis not present

## 2016-04-14 DIAGNOSIS — H35341 Macular cyst, hole, or pseudohole, right eye: Secondary | ICD-10-CM | POA: Diagnosis not present

## 2016-04-14 DIAGNOSIS — H04123 Dry eye syndrome of bilateral lacrimal glands: Secondary | ICD-10-CM | POA: Diagnosis not present

## 2016-06-08 DIAGNOSIS — N8110 Cystocele, unspecified: Secondary | ICD-10-CM | POA: Diagnosis not present

## 2016-06-08 DIAGNOSIS — E785 Hyperlipidemia, unspecified: Secondary | ICD-10-CM | POA: Diagnosis not present

## 2016-06-08 DIAGNOSIS — I10 Essential (primary) hypertension: Secondary | ICD-10-CM | POA: Diagnosis not present

## 2016-06-23 DIAGNOSIS — N819 Female genital prolapse, unspecified: Secondary | ICD-10-CM | POA: Diagnosis not present

## 2016-06-23 DIAGNOSIS — R3915 Urgency of urination: Secondary | ICD-10-CM | POA: Diagnosis not present

## 2016-07-16 DIAGNOSIS — Z466 Encounter for fitting and adjustment of urinary device: Secondary | ICD-10-CM | POA: Diagnosis not present

## 2016-07-16 DIAGNOSIS — N814 Uterovaginal prolapse, unspecified: Secondary | ICD-10-CM | POA: Diagnosis not present

## 2016-07-16 DIAGNOSIS — N819 Female genital prolapse, unspecified: Secondary | ICD-10-CM | POA: Diagnosis not present

## 2016-07-23 DIAGNOSIS — N819 Female genital prolapse, unspecified: Secondary | ICD-10-CM | POA: Diagnosis not present

## 2016-08-20 DIAGNOSIS — N819 Female genital prolapse, unspecified: Secondary | ICD-10-CM | POA: Diagnosis not present

## 2016-08-20 DIAGNOSIS — N898 Other specified noninflammatory disorders of vagina: Secondary | ICD-10-CM | POA: Diagnosis not present

## 2016-10-20 DIAGNOSIS — Z23 Encounter for immunization: Secondary | ICD-10-CM | POA: Diagnosis not present

## 2016-10-28 DIAGNOSIS — Z1231 Encounter for screening mammogram for malignant neoplasm of breast: Secondary | ICD-10-CM | POA: Diagnosis not present

## 2017-02-17 DIAGNOSIS — E785 Hyperlipidemia, unspecified: Secondary | ICD-10-CM | POA: Diagnosis not present

## 2017-02-17 DIAGNOSIS — Z0000 Encounter for general adult medical examination without abnormal findings: Secondary | ICD-10-CM | POA: Diagnosis not present

## 2017-02-17 DIAGNOSIS — I10 Essential (primary) hypertension: Secondary | ICD-10-CM | POA: Diagnosis not present

## 2017-02-17 DIAGNOSIS — Z1389 Encounter for screening for other disorder: Secondary | ICD-10-CM | POA: Diagnosis not present

## 2017-03-19 ENCOUNTER — Encounter: Payer: Self-pay | Admitting: Cardiology

## 2017-03-19 ENCOUNTER — Ambulatory Visit: Payer: Medicare Other | Admitting: Cardiology

## 2017-03-19 VITALS — BP 132/72 | HR 76 | Ht 62.0 in | Wt 162.6 lb

## 2017-03-19 DIAGNOSIS — E785 Hyperlipidemia, unspecified: Secondary | ICD-10-CM

## 2017-03-19 DIAGNOSIS — I495 Sick sinus syndrome: Secondary | ICD-10-CM | POA: Diagnosis not present

## 2017-03-19 DIAGNOSIS — I10 Essential (primary) hypertension: Secondary | ICD-10-CM | POA: Diagnosis not present

## 2017-03-19 DIAGNOSIS — I471 Supraventricular tachycardia: Secondary | ICD-10-CM

## 2017-03-19 MED ORDER — PRAVASTATIN SODIUM 80 MG PO TABS: 80.0000 mg | ORAL_TABLET | Freq: Every day | ORAL | 3 refills | Status: AC

## 2017-03-19 MED ORDER — BENAZEPRIL-HYDROCHLOROTHIAZIDE 20-12.5 MG PO TABS
0.5000 | ORAL_TABLET | Freq: Two times a day (BID) | ORAL | 3 refills | Status: DC
Start: 2017-03-19 — End: 2018-04-11

## 2017-03-19 NOTE — Progress Notes (Addendum)
Patient ID: Anna Dawson, female   DOB: [birth year], 77 y.o.   MRN: [id number]     Cardiology Office Note  Date:  03/19/2017   ID:  Anna Dawson, DOB [birth year], MRN [id number]  PCP:  Harlan Stains, MD  Cardiologist:  Dr Meda Coffee Primary Electrophysiologist: Ena Dawley, MD    Chief complain: 1 year follow up post SVT ablation   History of Present Illness: Anna Dawson is a 77 y.o. 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 She denies procedure related complications.  She has had no further SVT and is pleased with her result.  She denies any symptoms of bradycardia. She stays active and is asymptomatic, denies palpitations, syncope, chest pain, orthopnea, PND.   02/19/2016 - 1 year follow up, the patient denies any further palpitations. She remains active and denies any dizziness, presyncope or syncope. She denies any chest pain or shortness of breath. She has been compliant with her meds with no side effects. She follows with Dr. Harlan Stains at [place] and has had her blood work checked last months.  03/19/2017 - this is one year follow-up, the patient feels and looks great, she continues to walk almost daily, has no chest pain shortness of breath no dyspnea on exertion no lower extremity edema orthopnea paroxysmal nocturnal dyspnea no claudication. She's been tolerating all her medications well, has no side effects. She has no palpitations whatsoever since her ablation.  Labs from PCP from January 2019, hemoglobin 15.6 platelets 261, vitamin D level 31.8, triglycerides 1:15, LDL 97, HDL 67, glucose 84, creatinine 0.68, potassium 4.2, AST 16, ALT 17, TSH 0.9.  Past Medical History:  Diagnosis Date  . Arthritis    right knee  . Cataracts, bilateral 1994  . GERD (gastroesophageal reflux disease)   . Hyperlipidemia   . Hypertension   . Seasonal allergies   . Sick sinus syndrome ([place])   . SVT  (supraventricular tachycardia) ([facility])    Past Surgical History:  Procedure Laterality Date  . CATARACT EXTRACTION, BILATERAL  1994  . COLONOSCOPY  2006  . EYELID SURGERY  2006   BILATERAL  . KNEE ARTHROSCOPY  2011   RIGHT KNEE  . RETINAL LASER PROCEDURE  1995  . SUPRAVENTRICULAR TACHYCARDIA ABLATION N/A 02/23/2014   Procedure: SUPRAVENTRICULAR TACHYCARDIA ABLATION;  Surgeon: Thompson Grayer, MD;  Location: [facility] CATH LAB;  Service: Cardiovascular;  Laterality: N/A;  . TUBAL LIGATION     Current Outpatient Medications  Medication Sig Dispense Refill  . aspirin EC 81 MG tablet Take 1 tablet (81 mg total) by mouth daily. 90 tablet 3  . benazepril-hydrochlorthiazide (LOTENSIN HCT) 20-12.5 MG per tablet Take 0.5 tablets by mouth 2 (two) times daily.     . Cholecalciferol (VITAMIN D PO) Take 1 capsule by mouth daily.    . Cholecalciferol (VITAMIN D) 2000 UNITS CAPS Take 1 capsule by mouth daily.    . Glucosamine-Chondroitin (GLUCOSAMINE CHONDR COMPLEX PO) Take 1 tablet by mouth daily.    . Multiple Vitamin (MULTIVITAMIN) tablet Take 1 tablet by mouth daily.    Marland Kitchen OVER THE COUNTER MEDICATION Apply 1 application topically daily as needed (stress relief/ sleep). Essential Oil    . pravastatin (PRAVACHOL) 80 MG tablet Take 80 mg by mouth daily.      No current facility-administered medications for this visit.    Allergies:   Amlodipine besylate   Social History:  The patient  reports that  has never smoked. she has never used smokeless tobacco. She reports that she does not drink alcohol.   Family History:  The patient's  family history includes Arrhythmia in her sister; CVA in her father; Colon cancer in her mother; Hypercholesterolemia in her father and sister; Hypertension in her father, mother, and sister; Lung cancer in her sister; Mitral valve prolapse in her sister.   ROS:  Please see the history of present illness.   All other systems are reviewed and negative.   PHYSICAL EXAM: VS:  BP  132/72   Pulse 76   Ht 5\' 2"  (1.575 m)   Wt 162 lb 9.6 oz (73.8 kg)   SpO2 98%   BMI 29.74 kg/m  , BMI Body mass index is 29.74 kg/m. GEN: Well nourished, well developed, in no acute distress  HEENT: normal  Neck: no JVD, carotid bruits, or masses Cardiac: RRR; no murmurs, rubs, or gallops,no edema  Respiratory:  clear to auscultation bilaterally, normal work of breathing GI: soft, nontender, nondistended, + BS MS: no deformity or atrophy  Skin: warm and dry  Neuro:  Strength and sensation are intact Psych: euthymic mood, full affect  EKG:  EKG is ordered today. The ekg ordered today shows sinus rhythm 63 bpm, PR 164, poor R wave progression  Lipid Panel  No results found for: CHOL, TRIG, HDL, CHOLHDL, VLDL, LDLCALC, LDLDIRECT  Wt Readings from Last 3 Encounters:  03/19/17 162 lb 9.6 oz (73.8 kg)  02/19/16 163 lb (73.9 kg)  02/25/15 164 lb (74.4 kg)   TTE: 08/2014  - Left ventricle: The cavity size was normal. Wall thickness was normal. Systolic function was normal. The estimated ejection fraction was in the range of 55% to 60%. Wall motion was normal; there were no regional wall motion abnormalities. Doppler parameters are consistent with abnormal left ventricular relaxation (grade 1 diastolic dysfunction). The E/e&' ratio is between 8-15, suggesting indeterminate LV filling pressure. - Mitral valve: Calcified annulus. There was trivial regurgitation. - Left atrium: The atrium was normal in size. - Tricuspid valve: There was mild regurgitation. - Pulmonary arteries: PA peak pressure: 40 mm Hg (S).  Impressions: - LVEF 55-60%, normal wall thickness, diastolic dysfunction, indeterminate LV filling pressure, normal LA size, mild TR, RVSP 40 mmHg.  ECG: Sinus rhythm, poor R-wave progression in the anterior leads, otherwise unchanged from prior. This was personally reviewed.    ASSESSMENT AND PLAN:  1.  SVT Resolved s/p ablation of AVNRT,  asymptomatic on no medication.  2. Bradycardia This has resolved.  3. HTN Stable, tolerating her medications well.  4. Pulmonary HTN - rechecked echo in august 2016 showed PASP 36 mmHg. she is asymptomatic.  5. Hyperlipidemia - followed by PCP, on pravastatin 80 mg po daily.  Really got Requests labs from PCP. Follow up in 1 year  Dorothy Spark, MD, [facility] 03/19/2017

## 2017-03-19 NOTE — Patient Instructions (Signed)

## 2017-03-24 DIAGNOSIS — L814 Other melanin hyperpigmentation: Secondary | ICD-10-CM | POA: Diagnosis not present

## 2017-03-24 DIAGNOSIS — Z86018 Personal history of other benign neoplasm: Secondary | ICD-10-CM | POA: Diagnosis not present

## 2017-03-24 DIAGNOSIS — L821 Other seborrheic keratosis: Secondary | ICD-10-CM | POA: Diagnosis not present

## 2017-03-24 DIAGNOSIS — D1800 Hemangioma unspecified site: Secondary | ICD-10-CM | POA: Diagnosis not present

## 2017-03-24 DIAGNOSIS — T148XXA Other injury of unspecified body region, initial encounter: Secondary | ICD-10-CM | POA: Diagnosis not present

## 2017-03-26 DIAGNOSIS — L03114 Cellulitis of left upper limb: Secondary | ICD-10-CM | POA: Diagnosis not present

## 2017-04-30 DIAGNOSIS — S61211A Laceration without foreign body of left index finger without damage to nail, initial encounter: Secondary | ICD-10-CM | POA: Diagnosis not present

## 2017-04-30 DIAGNOSIS — I10 Essential (primary) hypertension: Secondary | ICD-10-CM | POA: Diagnosis not present

## 2017-06-07 DIAGNOSIS — H04123 Dry eye syndrome of bilateral lacrimal glands: Secondary | ICD-10-CM | POA: Diagnosis not present

## 2017-06-07 DIAGNOSIS — H5213 Myopia, bilateral: Secondary | ICD-10-CM | POA: Diagnosis not present

## 2017-06-07 DIAGNOSIS — H52223 Regular astigmatism, bilateral: Secondary | ICD-10-CM | POA: Diagnosis not present

## 2017-06-07 DIAGNOSIS — H35341 Macular cyst, hole, or pseudohole, right eye: Secondary | ICD-10-CM | POA: Diagnosis not present

## 2017-08-24 DIAGNOSIS — Z01419 Encounter for gynecological examination (general) (routine) without abnormal findings: Secondary | ICD-10-CM | POA: Diagnosis not present

## 2017-08-24 DIAGNOSIS — N819 Female genital prolapse, unspecified: Secondary | ICD-10-CM | POA: Diagnosis not present

## 2017-08-24 DIAGNOSIS — N898 Other specified noninflammatory disorders of vagina: Secondary | ICD-10-CM | POA: Diagnosis not present

## 2017-11-01 DIAGNOSIS — Z780 Asymptomatic menopausal state: Secondary | ICD-10-CM | POA: Diagnosis not present

## 2017-11-01 DIAGNOSIS — M8589 Other specified disorders of bone density and structure, multiple sites: Secondary | ICD-10-CM | POA: Diagnosis not present

## 2017-11-01 DIAGNOSIS — Z1231 Encounter for screening mammogram for malignant neoplasm of breast: Secondary | ICD-10-CM | POA: Diagnosis not present

## 2017-11-17 DIAGNOSIS — E785 Hyperlipidemia, unspecified: Secondary | ICD-10-CM | POA: Diagnosis not present

## 2017-11-17 DIAGNOSIS — J301 Allergic rhinitis due to pollen: Secondary | ICD-10-CM | POA: Diagnosis not present

## 2017-11-17 DIAGNOSIS — Z23 Encounter for immunization: Secondary | ICD-10-CM | POA: Diagnosis not present

## 2017-11-17 DIAGNOSIS — I10 Essential (primary) hypertension: Secondary | ICD-10-CM | POA: Diagnosis not present

## 2018-02-03 DIAGNOSIS — Z0120 Encounter for dental examination and cleaning without abnormal findings: Secondary | ICD-10-CM | POA: Diagnosis not present

## 2018-03-14 ENCOUNTER — Encounter: Payer: Self-pay | Admitting: Cardiology

## 2018-04-11 ENCOUNTER — Other Ambulatory Visit: Payer: Self-pay

## 2018-04-11 ENCOUNTER — Ambulatory Visit: Payer: Medicare Other | Admitting: Cardiology

## 2018-04-11 ENCOUNTER — Encounter: Payer: Self-pay | Admitting: Cardiology

## 2018-04-11 ENCOUNTER — Encounter (INDEPENDENT_AMBULATORY_CARE_PROVIDER_SITE_OTHER): Payer: Self-pay

## 2018-04-11 VITALS — BP 124/76 | HR 55 | Ht 62.0 in | Wt 163.4 lb

## 2018-04-11 DIAGNOSIS — I272 Pulmonary hypertension, unspecified: Secondary | ICD-10-CM | POA: Diagnosis not present

## 2018-04-11 DIAGNOSIS — I10 Essential (primary) hypertension: Secondary | ICD-10-CM

## 2018-04-11 DIAGNOSIS — I471 Supraventricular tachycardia: Secondary | ICD-10-CM | POA: Diagnosis not present

## 2018-04-11 DIAGNOSIS — E785 Hyperlipidemia, unspecified: Secondary | ICD-10-CM | POA: Diagnosis not present

## 2018-04-11 NOTE — Progress Notes (Signed)
Patient ID: Anna Dawson, female   DOB: 02/13/1940, 78 y.o.   MRN: [id number]     Cardiology Office Note  Date:  04/11/2018   ID:  Anna Dawson, DOB 20-Dec-1940, MRN [id number]  PCP:  Harlan Stains, MD  Cardiologist:  Dr Meda Coffee Primary Electrophysiologist: Ena Dawley, MD    Chief complain: 1 year follow up   History of Present Illness: Anna Dawson is a 78 y.o. 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 She denies procedure related complications.  She has had no further SVT and is pleased with her result.  She denies any symptoms of bradycardia. She stays active and is asymptomatic, denies palpitations, syncope, chest pain, orthopnea, PND.   03/19/2017 - this is one year follow-up, the patient feels and looks great, she continues to walk almost daily, has no chest pain shortness of breath no dyspnea on exertion no lower extremity edema orthopnea paroxysmal nocturnal dyspnea no claudication. She's been tolerating all her medications well, has no side effects. She has no palpitations whatsoever since her ablation.  Labs from PCP from January 2019, hemoglobin 15.6 platelets 261, vitamin D level 31.8, triglycerides 1:15, LDL 97, HDL 67, glucose 84, creatinine 0.68, potassium 4.2, AST 16, ALT 17, TSH 0.9.  04/11/2018 -this is 1 year follow-up the patient has been doing great, she has no further palpitations, no chest pain or shortness of breath.  She denies any lower extremity edema or claudications.  She continues to take all of her medications except for aspirin and tolerates them well.  She stopped taking aspirin as she had dental work done and never restarted.  Past Medical History:  Diagnosis Date  . Arthritis    right knee  . Cataracts, bilateral 1994  . GERD (gastroesophageal reflux disease)   . Hyperlipidemia   . Hypertension   . Seasonal allergies   . Sick sinus syndrome ([place])   . SVT  (supraventricular tachycardia) (HCC)    Past Surgical History:  Procedure Laterality Date  . CATARACT EXTRACTION, BILATERAL  1994  . COLONOSCOPY  2006  . EYELID SURGERY  2006   BILATERAL  . KNEE ARTHROSCOPY  2011   RIGHT KNEE  . RETINAL LASER PROCEDURE  1995  . SUPRAVENTRICULAR TACHYCARDIA ABLATION N/A 02/23/2014   Procedure: SUPRAVENTRICULAR TACHYCARDIA ABLATION;  Surgeon: Thompson Grayer, MD;  Location: [facility] CATH LAB;  Service: Cardiovascular;  Laterality: N/A;  . TUBAL LIGATION     Current Outpatient Medications  Medication Sig Dispense Refill  . benazepril-hydrochlorthiazide (LOTENSIN HCT) 20-12.5 MG tablet Take 0.5 tablets by mouth daily.    . Cholecalciferol (VITAMIN D) 2000 UNITS CAPS Take 1 capsule by mouth daily.    . Glucosamine-Chondroitin (GLUCOSAMINE CHONDR COMPLEX PO) Take 1 tablet by mouth daily.    . Multiple Vitamin (MULTIVITAMIN) tablet Take 1 tablet by mouth daily.    Marland Kitchen OVER THE COUNTER MEDICATION Apply 1 application topically daily as needed (stress relief/ sleep). Essential Oil    . pravastatin (PRAVACHOL) 80 MG tablet Take 1 tablet (80 mg total) by mouth daily. 90 tablet 3   No current facility-administered medications for this visit.    Allergies:   Amlodipine besylate   Social History:  The patient  reports that she has never smoked. She has never used smokeless tobacco. She reports that she does not drink alcohol or use drugs.   Family History:  The patient's  family history includes Arrhythmia in  her sister; CVA in her father; Colon cancer in her mother; Hypercholesterolemia in her father and sister; Hypertension in her father, mother, and sister; Lung cancer in her sister; Mitral valve prolapse in her sister.   ROS:  Please see the history of present illness.   All other systems are reviewed and negative.   PHYSICAL EXAM: VS:  BP 124/76   Pulse (!) 55   Ht 5\' 2"  (1.575 m)   Wt 163 lb 6.4 oz (74.1 kg)   SpO2 92%   BMI 29.89 kg/m  , BMI Body mass index is  29.89 kg/m. GEN: Well nourished, well developed, in no acute distress  HEENT: normal  Neck: no JVD, carotid bruits, or masses Cardiac: RRR; no murmurs, rubs, or gallops,no edema  Respiratory:  clear to auscultation bilaterally, normal work of breathing GI: soft, nontender, nondistended, + BS MS: no deformity or atrophy  Skin: warm and dry  Neuro:  Strength and sensation are intact Psych: euthymic mood, full affect  EKG:  EKG is ordered today. The ekg ordered today shows sinus rhythm 63 bpm, PR 164, poor R wave progression  Lipid Panel  No results found for: CHOL, TRIG, HDL, CHOLHDL, VLDL, LDLCALC, LDLDIRECT  Wt Readings from Last 3 Encounters:  04/11/18 163 lb 6.4 oz (74.1 kg)  03/19/17 162 lb 9.6 oz (73.8 kg)  02/19/16 163 lb (73.9 kg)   TTE: 08/2014  - Left ventricle: The cavity size was normal. Wall thickness was normal. Systolic function was normal. The estimated ejection fraction was in the range of 55% to 60%. Wall motion was normal; there were no regional wall motion abnormalities. Doppler parameters are consistent with abnormal left ventricular relaxation (grade 1 diastolic dysfunction). The E/e&' ratio is between 8-15, suggesting indeterminate LV filling pressure. - Mitral valve: Calcified annulus. There was trivial regurgitation. - Left atrium: The atrium was normal in size. - Tricuspid valve: There was mild regurgitation. - Pulmonary arteries: PA peak pressure: 40 mm Hg (S).  Impressions: - LVEF 55-60%, normal wall thickness, diastolic dysfunction, indeterminate LV filling pressure, normal LA size, mild TR, RVSP 40 mmHg.  ECG: Normal sinus rhythm with low voltage QRS left anterior fascicular block, poor R wave progression in the anterolateral leads, this is unchanged from prior.   ASSESSMENT AND PLAN:  1.  SVT Resolved s/p ablation of AVNRT, asymptomatic on no medication.  2. Bradycardia This has resolved.  3. HTN Well-controlled,  primary care physician decreased some of the blood pressure medications as her blood pressure was low.  4. Pulmonary HTN - rechecked echo in august 2016 showed PASP 36 mmHg. she is asymptomatic.  We will repeat prior to the next year visit.  5. Hyperlipidemia - followed by PCP, on pravastatin 80 mg po daily.  All lipids at goal in January 2019, she will have it rechecked in April of this year.  Dorothy Spark, MD, [facility] 04/11/2018

## 2018-04-11 NOTE — Patient Instructions (Signed)
Medication Instructions:  Your provider recommends that you continue on your current medications as directed. Please refer to the Current Medication list given to you today.   If you need a refill on your cardiac medications before your next appointment, please call your pharmacy.   Testing/Procedures: Your provider has requested that you have an echocardiogram prior to your 1 year visit next March. Echocardiography is a painless test that uses sound waves to create images of your heart. It provides your doctor with information about the size and shape of your heart and how well your heart's chambers and valves are working. This procedure takes approximately one hour. There are no restrictions for this procedure.    Follow-Up: At [facility], you and your health needs are our priority.  As part of our continuing mission to provide you with exceptional heart care, we have created designated Provider Care Teams.  These Care Teams include your primary Cardiologist (physician) and Advanced Practice Providers (APPs -  Physician Assistants and Nurse Practitioners) who all work together to provide you with the care you need, when you need it. You will need a follow up appointment in 12 months.  Please call our office 2 months in advance to schedule this appointment.  You may see Ena Dawley, MD or one of the following Advanced Practice Providers on your designated Care Team:   [place], PA-C Melina Copa, PA-C . Ermalinda Barrios, PA-C

## 2018-04-27 DIAGNOSIS — Z0000 Encounter for general adult medical examination without abnormal findings: Secondary | ICD-10-CM | POA: Diagnosis not present

## 2018-04-27 DIAGNOSIS — I10 Essential (primary) hypertension: Secondary | ICD-10-CM | POA: Diagnosis not present

## 2018-04-27 DIAGNOSIS — Z1389 Encounter for screening for other disorder: Secondary | ICD-10-CM | POA: Diagnosis not present

## 2018-04-27 DIAGNOSIS — E785 Hyperlipidemia, unspecified: Secondary | ICD-10-CM | POA: Diagnosis not present

## 2018-06-29 DIAGNOSIS — E785 Hyperlipidemia, unspecified: Secondary | ICD-10-CM | POA: Diagnosis not present

## 2018-06-29 DIAGNOSIS — E559 Vitamin D deficiency, unspecified: Secondary | ICD-10-CM | POA: Diagnosis not present

## 2018-06-29 DIAGNOSIS — I10 Essential (primary) hypertension: Secondary | ICD-10-CM | POA: Diagnosis not present

## 2018-08-08 DIAGNOSIS — Z0120 Encounter for dental examination and cleaning without abnormal findings: Secondary | ICD-10-CM | POA: Diagnosis not present

## 2018-08-30 DIAGNOSIS — N898 Other specified noninflammatory disorders of vagina: Secondary | ICD-10-CM | POA: Diagnosis not present

## 2018-08-30 DIAGNOSIS — N8110 Cystocele, unspecified: Secondary | ICD-10-CM | POA: Diagnosis not present

## 2018-09-29 DIAGNOSIS — H5213 Myopia, bilateral: Secondary | ICD-10-CM | POA: Diagnosis not present

## 2018-11-07 DIAGNOSIS — Z1231 Encounter for screening mammogram for malignant neoplasm of breast: Secondary | ICD-10-CM | POA: Diagnosis not present

## 2018-11-16 DIAGNOSIS — I272 Pulmonary hypertension, unspecified: Secondary | ICD-10-CM | POA: Diagnosis not present

## 2018-11-16 DIAGNOSIS — E785 Hyperlipidemia, unspecified: Secondary | ICD-10-CM | POA: Diagnosis not present

## 2018-11-16 DIAGNOSIS — I10 Essential (primary) hypertension: Secondary | ICD-10-CM | POA: Diagnosis not present

## 2018-11-16 DIAGNOSIS — F419 Anxiety disorder, unspecified: Secondary | ICD-10-CM | POA: Diagnosis not present

## 2018-11-23 DIAGNOSIS — Z86018 Personal history of other benign neoplasm: Secondary | ICD-10-CM | POA: Diagnosis not present

## 2018-11-23 DIAGNOSIS — L814 Other melanin hyperpigmentation: Secondary | ICD-10-CM | POA: Diagnosis not present

## 2018-11-23 DIAGNOSIS — L821 Other seborrheic keratosis: Secondary | ICD-10-CM | POA: Diagnosis not present

## 2018-11-23 DIAGNOSIS — Z23 Encounter for immunization: Secondary | ICD-10-CM | POA: Diagnosis not present

## 2019-02-15 DIAGNOSIS — Z0120 Encounter for dental examination and cleaning without abnormal findings: Secondary | ICD-10-CM | POA: Diagnosis not present

## 2019-03-05 ENCOUNTER — Ambulatory Visit: Payer: Medicare Other | Attending: Internal Medicine

## 2019-03-05 DIAGNOSIS — Z23 Encounter for immunization: Secondary | ICD-10-CM | POA: Insufficient documentation

## 2019-03-05 NOTE — Progress Notes (Signed)
   Covid-19 Vaccination Clinic  Name:  Anna Dawson    MRN: [id number] DOB: [birth year]  03/05/2019  Anna Dawson was observed post Covid-19 immunization for 15 minutes without incidence. She was provided with Vaccine Information Sheet and instruction to access the V-Safe system.   Anna Dawson was instructed to call 911 with any severe reactions post vaccine: Marland Kitchen Difficulty breathing  . Swelling of your face and throat  . A fast heartbeat  . A bad rash all over your body  . Dizziness and weakness    Immunizations Administered    Name Date Dose VIS Date Route   Pfizer COVID-19 Vaccine 03/05/2019 11:40 AM 0.3 mL 01/06/2019 Intramuscular   Manufacturer: [place]   Lot: [id number]   [place]: [id number]

## 2019-03-26 ENCOUNTER — Ambulatory Visit: Payer: Medicare Other

## 2019-03-30 ENCOUNTER — Ambulatory Visit: Payer: Medicare Other | Attending: Internal Medicine

## 2019-03-30 DIAGNOSIS — Z23 Encounter for immunization: Secondary | ICD-10-CM | POA: Insufficient documentation

## 2019-03-30 NOTE — Progress Notes (Signed)
   Covid-19 Vaccination Clinic  Name:  Elecia Rehagen    MRN: [id number] DOB: [birth year]  03/30/2019  Ms. Sinnett was observed post Covid-19 immunization for 15 minutes without incident. She was provided with Vaccine Information Sheet and instruction to access the V-Safe system.   Ms. Belarde was instructed to call 911 with any severe reactions post vaccine: Marland Kitchen Difficulty breathing  . Swelling of face and throat  . A fast heartbeat  . A bad rash all over body  . Dizziness and weakness   Immunizations Administered    Name Date Dose VIS Date Route   Pfizer COVID-19 Vaccine 03/30/2019 10:45 AM 0.3 mL 01/06/2019 Intramuscular   Manufacturer: [place]   Lot: [id number]   [place]: [id number]

## 2019-04-24 ENCOUNTER — Other Ambulatory Visit: Payer: Self-pay

## 2019-04-24 ENCOUNTER — Ambulatory Visit (HOSPITAL_COMMUNITY): Payer: Medicare Other | Attending: Cardiology

## 2019-04-24 DIAGNOSIS — I272 Pulmonary hypertension, unspecified: Secondary | ICD-10-CM

## 2019-05-09 ENCOUNTER — Other Ambulatory Visit: Payer: Self-pay

## 2019-05-09 ENCOUNTER — Encounter: Payer: Self-pay | Admitting: Cardiology

## 2019-05-09 ENCOUNTER — Ambulatory Visit: Payer: Medicare Other | Admitting: Cardiology

## 2019-05-09 VITALS — BP 132/82 | HR 77 | Ht 62.0 in | Wt 164.2 lb

## 2019-05-09 DIAGNOSIS — I471 Supraventricular tachycardia: Secondary | ICD-10-CM

## 2019-05-09 DIAGNOSIS — I10 Essential (primary) hypertension: Secondary | ICD-10-CM

## 2019-05-09 DIAGNOSIS — E785 Hyperlipidemia, unspecified: Secondary | ICD-10-CM

## 2019-05-09 DIAGNOSIS — I4719 Other supraventricular tachycardia: Secondary | ICD-10-CM

## 2019-05-09 NOTE — Patient Instructions (Signed)
Medication Instructions:  ° °Your physician recommends that you continue on your current medications as directed. Please refer to the Current Medication list given to you today. ° °*If you need a refill on your cardiac medications before your next appointment, please call your pharmacy* ° °Follow-Up: °At [facility], you and your health needs are our priority.  As part of our continuing mission to provide you with exceptional heart care, we have created designated Provider Care Teams.  These Care Teams include your primary Cardiologist (physician) and Advanced Practice Providers (APPs -  Physician Assistants and Nurse Practitioners) who all work together to provide you with the care you need, when you need it. ° °We recommend signing up for the patient portal called "MyChart".  Sign up information is provided on this After Visit Summary.  MyChart is used to connect with patients for Virtual Visits (Telemedicine).  Patients are able to view lab/test results, encounter notes, upcoming appointments, etc.  Non-urgent messages can be sent to your provider as well.   °To learn more about what you can do with MyChart, go to [email or web address].   ° °Your next appointment:   °12 month(s) ° °The format for your next appointment:   °In Person ° °Provider:   °Katarina Nelson, MD ° ° ° ° °

## 2019-05-09 NOTE — Progress Notes (Signed)
Patient ID: Anna Dawson, female   DOB: [birth year], 79 y.o.   MRN: [id number]     Cardiology Office Note  Date:  05/09/2019   ID:  Anna Dawson, DOB 1940-02-24, MRN [id number]  PCP:  Harlan Stains, MD  Cardiologist:  Dr Meda Coffee Primary Electrophysiologist: Ena Dawley, MD    Chief complain: 1 year follow up   History of Present Illness: Anna Dawson is a 79 y.o. 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 She denies procedure related complications.  She has had no further SVT and is pleased with her result.  She denies any symptoms of bradycardia. She stays active and is asymptomatic, denies palpitations, syncope, chest pain, orthopnea, PND.   03/19/2017 - this is one year follow-up, the patient feels and looks great, she continues to walk almost daily, has no chest pain shortness of breath no dyspnea on exertion no lower extremity edema orthopnea paroxysmal nocturnal dyspnea no claudication. She's been tolerating all her medications well, has no side effects. She has no palpitations whatsoever since her ablation.  Labs from PCP from January 2019, hemoglobin 15.6 platelets 261, vitamin D level 31.8, triglycerides 1:15, LDL 97, HDL 67, glucose 84, creatinine 0.68, potassium 4.2, AST 16, ALT 17, TSH 0.9.  04/11/2018 -this is 1 year follow-up the patient has been doing great, she has no further palpitations, no chest pain or shortness of breath.  She denies any lower extremity edema or claudications.  She continues to take all of her medications except for aspirin and tolerates them well.  She stopped taking aspirin as she had dental work done and never restarted.  05/09/2019 -the patient is doing great, she walks a mile or 2 several times a week and has no symptoms of chest pain or shortness of breath, no palpitation dizziness or syncope.  No lower extremity edema or claudications.  She has been tolerating  her medications well.  Past Medical History:  Diagnosis Date  . Arthritis    right knee  . Cataracts, bilateral 1994  . GERD (gastroesophageal reflux disease)   . Hyperlipidemia   . Hypertension   . Seasonal allergies   . Sick sinus syndrome ([place])   . SVT (supraventricular tachycardia) (HCC)    Past Surgical History:  Procedure Laterality Date  . CATARACT EXTRACTION, BILATERAL  1994  . COLONOSCOPY  2006  . EYELID SURGERY  2006   BILATERAL  . KNEE ARTHROSCOPY  2011   RIGHT KNEE  . RETINAL LASER PROCEDURE  1995  . SUPRAVENTRICULAR TACHYCARDIA ABLATION N/A 02/23/2014   Procedure: SUPRAVENTRICULAR TACHYCARDIA ABLATION;  Surgeon: Thompson Grayer, MD;  Location: [facility] CATH LAB;  Service: Cardiovascular;  Laterality: N/A;  . TUBAL LIGATION     Current Outpatient Medications  Medication Sig Dispense Refill  . benazepril-hydrochlorthiazide (LOTENSIN HCT) 20-12.5 MG tablet Take 0.5 tablets by mouth daily.    . Cholecalciferol (VITAMIN D) 2000 UNITS CAPS Take 1 capsule by mouth daily.    . Multiple Vitamin (MULTIVITAMIN) tablet Take 1 tablet by mouth daily.    Marland Kitchen OVER THE COUNTER MEDICATION Apply 1 application topically daily as needed (stress relief/ sleep). Essential Oil    . pravastatin (PRAVACHOL) 80 MG tablet Take 1 tablet (80 mg total) by mouth daily. 90 tablet 3   No current facility-administered medications for this visit.   Allergies:   Amlodipine besylate   Social History:  The patient  reports that she has  never smoked. She has never used smokeless tobacco. She reports that she does not drink alcohol or use drugs.   Family History:  The patient's  family history includes Arrhythmia in her sister; CVA in her father; Colon cancer in her mother; Hypercholesterolemia in her father and sister; Hypertension in her father, mother, and sister; Lung cancer in her sister; Mitral valve prolapse in her sister.   ROS:  Please see the history of present illness.   All other systems are reviewed  and negative.   PHYSICAL EXAM: VS:  BP 132/82   Pulse 77   Ht 5\' 2"  (1.575 m)   Wt 164 lb 3.2 oz (74.5 kg)   SpO2 96%   BMI 30.03 kg/m  , BMI Body mass index is 30.03 kg/m. GEN: Well nourished, well developed, in no acute distress  HEENT: normal  Neck: no JVD, carotid bruits, or masses Cardiac: RRR; no murmurs, rubs, or gallops,no edema  Respiratory:  clear to auscultation bilaterally, normal work of breathing GI: soft, nontender, nondistended, + BS MS: no deformity or atrophy  Skin: warm and dry  Neuro:  Strength and sensation are intact Psych: euthymic mood, full affect  EKG:  EKG is ordered today. The ekg ordered today shows sinus rhythm 63 bpm, PR 164, poor R wave progression  Lipid Panel  No results found for: CHOL, TRIG, HDL, CHOLHDL, VLDL, LDLCALC, LDLDIRECT  Wt Readings from Last 3 Encounters:  05/09/19 164 lb 3.2 oz (74.5 kg)  04/11/18 163 lb 6.4 oz (74.1 kg)  03/19/17 162 lb 9.6 oz (73.8 kg)   TTE: 08/2014  - Left ventricle: The cavity size was normal. Wall thickness was normal. Systolic function was normal. The estimated ejection fraction was in the range of 55% to 60%. Wall motion was normal; there were no regional wall motion abnormalities. Doppler parameters are consistent with abnormal left ventricular relaxation (grade 1 diastolic dysfunction). The E/e&' ratio is between 8-15, suggesting indeterminate LV filling pressure. - Mitral valve: Calcified annulus. There was trivial regurgitation. - Left atrium: The atrium was normal in size. - Tricuspid valve: There was mild regurgitation. - Pulmonary arteries: PA peak pressure: 40 mm Hg (S).  Impressions: - LVEF 55-60%, normal wall thickness, diastolic dysfunction, indeterminate LV filling pressure, normal LA size, mild TR, RVSP 40 mmHg.  TTE: 04/24/2019  1. Normal LV systolic function; grade 1 diastolic dysfunction; mild LVH.  2. Left ventricular ejection fraction, by estimation, is 60  to 65%. The  left ventricle has normal function. The left ventricle has no regional  wall motion abnormalities. There is mild left ventricular hypertrophy.  Left ventricular diastolic parameters  are consistent with Grade I diastolic dysfunction (impaired relaxation).  3. Right ventricular systolic function is normal. The right ventricular  size is normal. Tricuspid regurgitation signal is inadequate for assessing  PA pressure.  4. The mitral valve is normal in structure. Trivial mitral valve  regurgitation. No evidence of mitral stenosis.  5. The aortic valve is tricuspid. Aortic valve regurgitation is not  visualized. No aortic stenosis is present.  6. The inferior vena cava is normal in size with greater than 50%  respiratory variability, suggesting right atrial pressure of 3 mmHg.   ECG: Normal sinus rhythm with low voltage QRS left anterior fascicular block, poor R wave progression in the anterolateral leads, this is unchanged from prior.   ASSESSMENT AND PLAN:  SVT Resolved s/p ablation of AVNRT, asymptomatic on no medication.  HTN Well-controlled, no orthostatic hypotension.  Her echocardiogram  showed LVEF 60 to 65% with mild concentric LVH and stage I diastolic dysfunction.  Pulmonary HTN -has resolved on most recent echo.  Hyperlipidemia -recent lipids HDL 70, triglycerides 113, LDL 107.  We will continue pravastatin that she tolerates well.  Follow-up in 1 year.  Dorothy Spark, MD, [facility] 05/09/2019

## 2019-05-25 DIAGNOSIS — I10 Essential (primary) hypertension: Secondary | ICD-10-CM | POA: Diagnosis not present

## 2019-05-25 DIAGNOSIS — Z0000 Encounter for general adult medical examination without abnormal findings: Secondary | ICD-10-CM | POA: Diagnosis not present

## 2019-05-25 DIAGNOSIS — E785 Hyperlipidemia, unspecified: Secondary | ICD-10-CM | POA: Diagnosis not present

## 2019-05-25 DIAGNOSIS — Z1389 Encounter for screening for other disorder: Secondary | ICD-10-CM | POA: Diagnosis not present

## 2019-08-10 DIAGNOSIS — Z0120 Encounter for dental examination and cleaning without abnormal findings: Secondary | ICD-10-CM | POA: Diagnosis not present

## 2019-08-17 DIAGNOSIS — Z4689 Encounter for fitting and adjustment of other specified devices: Secondary | ICD-10-CM | POA: Diagnosis not present

## 2019-08-17 DIAGNOSIS — N898 Other specified noninflammatory disorders of vagina: Secondary | ICD-10-CM | POA: Diagnosis not present

## 2019-09-19 DIAGNOSIS — R0981 Nasal congestion: Secondary | ICD-10-CM | POA: Diagnosis not present

## 2019-09-19 DIAGNOSIS — Z20822 Contact with and (suspected) exposure to covid-19: Secondary | ICD-10-CM | POA: Diagnosis not present

## 2019-09-20 ENCOUNTER — Other Ambulatory Visit: Payer: Self-pay | Admitting: Oncology

## 2019-09-20 ENCOUNTER — Encounter: Payer: Self-pay | Admitting: Oncology

## 2019-09-20 DIAGNOSIS — U071 COVID-19: Secondary | ICD-10-CM

## 2019-09-20 NOTE — Progress Notes (Signed)
I connected by phone with  Anna Dawson to discuss the potential use of an new treatment for mild to moderate COVID-19 viral infection in non-hospitalized patients.   This patient is a age/sex that meets the FDA criteria for Emergency Use Authorization of casirivimab\imdevimab.  Has a (+) direct SARS-CoV-2 viral test result 1. Has mild or moderate COVID-19  2. Is ? 81 years of age and weighs ? 40 kg 3. Is NOT hospitalized due to COVID-19 4. Is NOT requiring oxygen therapy or requiring an increase in baseline oxygen flow rate due to COVID-19 5. Is within 10 days of symptom onset 6. Has at least one of the high risk factor(s) for progression to severe COVID-19 and/or hospitalization as defined in EUA. ? Specific high risk criteria :age    Symptom onset  09/17/19   I have spoken and communicated the following to the patient or parent/caregiver:   1. FDA has authorized the emergency use of casirivimab\imdevimab for the treatment of mild to moderate COVID-19 in adults and pediatric patients with positive results of direct SARS-CoV-2 viral testing who are 50 years of age and older weighing at least 40 kg, and who are at high risk for progressing to severe COVID-19 and/or hospitalization.   2. The significant known and potential risks and benefits of casirivimab\imdevimab, and the extent to which such potential risks and benefits are unknown.   3. Information on available alternative treatments and the risks and benefits of those alternatives, including clinical trials.   4. Patients treated with casirivimab\imdevimab should continue to self-isolate and use infection control measures (e.g., wear mask, isolate, social distance, avoid sharing personal items, clean and disinfect "high touch" surfaces, and frequent handwashing) according to CDC guidelines.    5. The patient or parent/caregiver has the option to accept or refuse casirivimab\imdevimab .   After reviewing this information with the  patient, The patient agreed to proceed with receiving casirivimab\imdevimab infusion and will be provided a copy of the Fact sheet prior to receiving the infusion.Rulon Abide, AGNP-C [phone] ([place])

## 2019-09-21 ENCOUNTER — Ambulatory Visit (HOSPITAL_COMMUNITY)
Admission: RE | Admit: 2019-09-21 | Discharge: 2019-09-21 | Disposition: A | Discharge status: Home / Self Care | Payer: Medicare Other | Source: Ambulatory Visit | Attending: Pulmonary Disease | Admitting: Pulmonary Disease

## 2019-09-21 DIAGNOSIS — U071 COVID-19: Secondary | ICD-10-CM | POA: Insufficient documentation

## 2019-09-21 DIAGNOSIS — Z23 Encounter for immunization: Secondary | ICD-10-CM | POA: Insufficient documentation

## 2019-09-21 MED ORDER — FAMOTIDINE IN NACL 20-0.9 MG/50ML-% IV SOLN: 20.0000 mg | Freq: Once | INTRAVENOUS | Status: DC | PRN

## 2019-09-21 MED ORDER — DIPHENHYDRAMINE HCL 50 MG/ML IJ SOLN: 50.0000 mg | Freq: Once | INTRAMUSCULAR | Status: DC | PRN

## 2019-09-21 MED ORDER — METHYLPREDNISOLONE SODIUM SUCC 125 MG IJ SOLR: 125.0000 mg | Freq: Once | INTRAMUSCULAR | Status: DC | PRN

## 2019-09-21 MED ORDER — SODIUM CHLORIDE 0.9 % IV SOLN
1200.0000 mg | Freq: Once | INTRAVENOUS | Status: AC
  Administered 2019-09-21: 1200 mg via INTRAVENOUS
  Filled 2019-09-21: qty 10

## 2019-09-21 MED ORDER — EPINEPHRINE 0.3 MG/0.3ML IJ SOAJ: 0.3000 mg | Freq: Once | INTRAMUSCULAR | Status: DC | PRN

## 2019-09-21 MED ORDER — ALBUTEROL SULFATE HFA 108 (90 BASE) MCG/ACT IN AERS: 2.0000 | INHALATION_SPRAY | Freq: Once | RESPIRATORY_TRACT | Status: DC | PRN

## 2019-09-21 MED ORDER — SODIUM CHLORIDE 0.9 % IV SOLN: INTRAVENOUS | Status: DC | PRN

## 2019-09-21 NOTE — Discharge Instructions (Signed)

## 2019-09-21 NOTE — Progress Notes (Signed)
  Diagnosis: COVID-19  Physician: Dr. Joya Gaskins  Procedure: Cov[facility] Infusion Clinic Med: casirivimab\imdevimab infusion - Provided patient with casirivimab\imdevimab fact sheet for patients, parents and caregivers prior to infusion.  Complications: No immediate complications noted.  Discharge: Discharged home   Tia Masker 09/21/2019

## 2019-10-17 DIAGNOSIS — H524 Presbyopia: Secondary | ICD-10-CM | POA: Diagnosis not present

## 2019-11-02 DIAGNOSIS — H5213 Myopia, bilateral: Secondary | ICD-10-CM | POA: Diagnosis not present

## 2019-11-07 DIAGNOSIS — Z1231 Encounter for screening mammogram for malignant neoplasm of breast: Secondary | ICD-10-CM | POA: Diagnosis not present

## 2019-11-27 DIAGNOSIS — I10 Essential (primary) hypertension: Secondary | ICD-10-CM | POA: Diagnosis not present

## 2019-11-27 DIAGNOSIS — E785 Hyperlipidemia, unspecified: Secondary | ICD-10-CM | POA: Diagnosis not present

## 2019-11-27 DIAGNOSIS — Z8616 Personal history of COVID-19: Secondary | ICD-10-CM | POA: Diagnosis not present

## 2019-11-27 DIAGNOSIS — Z23 Encounter for immunization: Secondary | ICD-10-CM | POA: Diagnosis not present

## 2019-12-07 DIAGNOSIS — Z86018 Personal history of other benign neoplasm: Secondary | ICD-10-CM | POA: Diagnosis not present

## 2019-12-07 DIAGNOSIS — L821 Other seborrheic keratosis: Secondary | ICD-10-CM | POA: Diagnosis not present

## 2019-12-07 DIAGNOSIS — D225 Melanocytic nevi of trunk: Secondary | ICD-10-CM | POA: Diagnosis not present

## 2019-12-07 DIAGNOSIS — L814 Other melanin hyperpigmentation: Secondary | ICD-10-CM | POA: Diagnosis not present

## 2019-12-29 DIAGNOSIS — N6489 Other specified disorders of breast: Secondary | ICD-10-CM | POA: Diagnosis not present

## 2020-01-17 DIAGNOSIS — R928 Other abnormal and inconclusive findings on diagnostic imaging of breast: Secondary | ICD-10-CM | POA: Diagnosis not present

## 2020-05-12 NOTE — Progress Notes (Deleted)
Cardiology Office Note:    Date:  05/12/2020   ID:  Anna Dawson, DOB [birth year], MRN [id number]  PCP:  Anna Stains, MD   [name]  Cardiologist:  Anna Dawley, MD  Advanced Practice Provider:  No care team member to display Electrophysiologist:  None    Referring MD: Anna Stains, MD     History of Present Illness:    Anna Dawson is a 80 y.o. 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 Meda Dawson now returning to clinic for follow-up.  Last saw Dr. Meda Dawson on 05/09/19 where she was doing well. No palpitations. Remained active.  Past Medical History:  Diagnosis Date  . Arthritis    right knee  . Cataracts, bilateral 1994  . GERD (gastroesophageal reflux disease)   . Hyperlipidemia   . Hypertension   . Seasonal allergies   . Sick sinus syndrome ([place])   . SVT (supraventricular tachycardia) (HCC)     Past Surgical History:  Procedure Laterality Date  . CATARACT EXTRACTION, BILATERAL  1994  . COLONOSCOPY  2006  . EYELID SURGERY  2006   BILATERAL  . KNEE ARTHROSCOPY  2011   RIGHT KNEE  . RETINAL LASER PROCEDURE  1995  . SUPRAVENTRICULAR TACHYCARDIA ABLATION N/A 02/23/2014   Procedure: SUPRAVENTRICULAR TACHYCARDIA ABLATION;  Surgeon: Thompson Grayer, MD;  Location: [facility] CATH LAB;  Service: Cardiovascular;  Laterality: N/A;  . TUBAL LIGATION      Current Medications: No outpatient medications have been marked as taking for the 05/17/20 encounter (Appointment) with Freada Bergeron, MD.     Allergies:   Amlodipine besylate   Social History   Socioeconomic History  . Marital status: Divorced    Spouse name: Not on file  . Number of children: Not on file  . Years of education: Not on file  . Highest education level: Not on file  Occupational History  . Not on file  Tobacco Use  . Smoking status: Never Smoker  . Smokeless tobacco: Never Used  Vaping Use  .  Vaping Use: Never used  Substance and Sexual Activity  . Alcohol use: No    Comment: occasional   . Drug use: Never  . Sexual activity: Not on file  Other Topics Concern  . Not on file  Social History Narrative   Lives along in [place].  Retired.   Social Determinants of Health   Financial Resource Strain: Not on file  Food Insecurity: Not on file  Transportation Needs: Not on file  Physical Activity: Not on file  Stress: Not on file  Social Connections: Not on file     Family History: The patient's ***family history includes Arrhythmia in her sister; CVA in her father; Colon cancer in her mother; Hypercholesterolemia in her father and sister; Hypertension in her father, mother, and sister; Lung cancer in her sister; Mitral valve prolapse in her sister.  ROS:   Please see the history of present illness.    *** All other systems reviewed and are negative.  EKGs/Labs/Other Studies Reviewed:    The following studies were reviewed today: TTE: 08/2014  - Left ventricle: The cavity size was normal. Wall thickness was normal. Systolic function was normal. The estimated ejection fraction was in the range of 55% to 60%. Wall motion was normal; there were no regional wall motion abnormalities. Doppler parameters are consistent with abnormal left ventricular relaxation (grade 1 diastolic dysfunction). The E/e&'  ratio is between 8-15, suggesting indeterminate LV filling pressure. - Mitral valve: Calcified annulus. There was trivial regurgitation. - Left atrium: The atrium was normal in size. - Tricuspid valve: There was mild regurgitation. - Pulmonary arteries: PA peak pressure: 40 mm Hg (S).  Impressions: - LVEF 55-60%, normal wall thickness, diastolic dysfunction, indeterminate LV filling pressure, normal LA size, mild TR, RVSP 40 mmHg.  TTE: 04/24/2019  1. Normal LV systolic function; grade 1 diastolic dysfunction; mild LVH.  2. Left ventricular  ejection fraction, by estimation, is 60 to 65%. The  left ventricle has normal function. The left ventricle has no regional  wall motion abnormalities. There is mild left ventricular hypertrophy.  Left ventricular diastolic parameters  are consistent with Grade I diastolic dysfunction (impaired relaxation).  3. Right ventricular systolic function is normal. The right ventricular  size is normal. Tricuspid regurgitation signal is inadequate for assessing  PA pressure.  4. The mitral valve is normal in structure. Trivial mitral valve  regurgitation. No evidence of mitral stenosis.  5. The aortic valve is tricuspid. Aortic valve regurgitation is not  visualized. No aortic stenosis is present.  6. The inferior vena cava is normal in size with greater than 50%  respiratory variability, suggesting right atrial pressure of 3 mmHg.   EKG:  EKG is *** ordered today.  The ekg ordered today demonstrates ***  Recent Labs: No results found for requested labs within last 8760 hours.  Recent Lipid Panel No results found for: CHOL, TRIG, HDL, CHOLHDL, VLDL, LDLCALC, LDLDIRECT   Risk Assessment/Calculations:   {Does this patient have ATRIAL FIBRILLATION?:[id number]}   Physical Exam:    VS:  There were no vitals taken for this visit.    Wt Readings from Last 3 Encounters:  05/09/19 164 lb 3.2 oz (74.5 kg)  04/11/18 163 lb 6.4 oz (74.1 kg)  03/19/17 162 lb 9.6 oz (73.8 kg)     GEN: *** Well nourished, well developed in no acute distress HEENT: Normal NECK: No JVD; No carotid bruits LYMPHATICS: No lymphadenopathy CARDIAC: ***RRR, no murmurs, rubs, gallops RESPIRATORY:  Clear to auscultation without rales, wheezing or rhonchi  ABDOMEN: Soft, non-tender, non-distended MUSCULOSKELETAL:  No edema; No deformity  SKIN: Warm and dry NEUROLOGIC:  Alert and oriented x 3 PSYCHIATRIC:  Normal affect   ASSESSMENT:    No diagnosis found. PLAN:    In order of problems listed  above:  #AVNRT: S/p ablation with Dr. Rayann Heman with resolution of symptoms. Not on any nodal agents. -Continue to monitor  #HTN: -Continue benxapril-HCTZ   #HLD: -Continue pravastatin 80mg  daily   {Are you ordering a CV Procedure (e.g. stress test, cath, DCCV, TEE, etc)?   Press F2        :[id number]}    Medication Adjustments/Labs and Tests Ordered: Current medicines are reviewed at length with the patient today.  Concerns regarding medicines are outlined above.  No orders of the defined types were placed in this encounter.  No orders of the defined types were placed in this encounter.   There are no Patient Instructions on file for this visit.   Signed, Freada Bergeron, MD  05/12/2020 5:55 PM    [place]

## 2020-05-17 ENCOUNTER — Ambulatory Visit: Payer: Medicare Other | Admitting: Cardiology

## 2020-05-17 ENCOUNTER — Other Ambulatory Visit: Payer: Self-pay

## 2020-05-17 ENCOUNTER — Encounter: Payer: Self-pay | Admitting: Cardiology

## 2020-05-17 VITALS — BP 144/90 | HR 60 | Ht 62.0 in | Wt 160.6 lb

## 2020-05-17 DIAGNOSIS — I471 Supraventricular tachycardia: Secondary | ICD-10-CM | POA: Diagnosis not present

## 2020-05-17 DIAGNOSIS — E785 Hyperlipidemia, unspecified: Secondary | ICD-10-CM

## 2020-05-17 DIAGNOSIS — I10 Essential (primary) hypertension: Secondary | ICD-10-CM | POA: Diagnosis not present

## 2020-05-17 NOTE — Progress Notes (Signed)
Cardiology Office Note:    Date:  05/17/2020   ID:  Anna Dawson, DOB [birth year], MRN [id number]  PCP:  Harlan Stains, MD   [place]  Cardiologist:  Ena Dawley, MD  Advanced Practice Provider:  No care team member to display Electrophysiologist:  None    Referring MD: Harlan Stains, MD     History of Present Illness:    Anna Dawson is a 80 y.o. 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 Meda Coffee now returning to clinic for follow-up.  Last saw Dr. Meda Coffee on 05/09/19 where she was doing well. No palpitations. Remained active.  Today, she is feeling good. She remains active with no exertional symptoms. No palpitations, SOB, lightheadedness, dizziness.  Her blood pressure at home averages around 130/80.  No orthopnea or PND. Tolerating her medications without issues.    Past Medical History:  Diagnosis Date  . Arthritis    right knee  . Cataracts, bilateral 1994  . GERD (gastroesophageal reflux disease)   . Hyperlipidemia   . Hypertension   . Seasonal allergies   . Sick sinus syndrome ([place])   . SVT (supraventricular tachycardia) (HCC)     Past Surgical History:  Procedure Laterality Date  . CATARACT EXTRACTION, BILATERAL  1994  . COLONOSCOPY  2006  . EYELID SURGERY  2006   BILATERAL  . KNEE ARTHROSCOPY  2011   RIGHT KNEE  . RETINAL LASER PROCEDURE  1995  . SUPRAVENTRICULAR TACHYCARDIA ABLATION N/A 02/23/2014   Procedure: SUPRAVENTRICULAR TACHYCARDIA ABLATION;  Surgeon: Thompson Grayer, MD;  Location: [facility] CATH LAB;  Service: Cardiovascular;  Laterality: N/A;  . TUBAL LIGATION      Current Medications: Current Meds  Medication Sig  . benazepril-hydrochlorthiazide (LOTENSIN HCT) 20-12.5 MG tablet Take 0.5 tablets by mouth daily.  . Lactobacillus-Inulin (CULTURELLE DIGESTIVE DAILY PO)   . OVER THE COUNTER MEDICATION Apply 1 application topically daily as needed  (stress relief/ sleep). Essential Oil  . pravastatin (PRAVACHOL) 80 MG tablet Take 1 tablet (80 mg total) by mouth daily.  Marland Kitchen triamcinolone cream (KENALOG) 0.1 % Apply topically 2 (two) times daily.     Allergies:   Amlodipine besylate   Social History   Socioeconomic History  . Marital status: Divorced    Spouse name: Not on file  . Number of children: Not on file  . Years of education: Not on file  . Highest education level: Not on file  Occupational History  . Not on file  Tobacco Use  . Smoking status: Never Smoker  . Smokeless tobacco: Never Used  Vaping Use  . Vaping Use: Never used  Substance and Sexual Activity  . Alcohol use: No    Comment: occasional   . Drug use: Never  . Sexual activity: Not on file  Other Topics Concern  . Not on file  Social History Narrative   Lives along in [place].  Retired.   Social Determinants of Health   Financial Resource Strain: Not on file  Food Insecurity: Not on file  Transportation Needs: Not on file  Physical Activity: Not on file  Stress: Not on file  Social Connections: Not on file     Family History: The patient's family history includes Arrhythmia in her sister; CVA in her father; Colon cancer in her mother; Hypercholesterolemia in her father and sister; Hypertension in her father, mother, and sister; Lung cancer in her sister; Mitral valve prolapse  in her sister.  ROS:   Please see the history of present illness.    Review of Systems  Constitutional: Negative for fever and malaise/fatigue.  HENT: Negative for hearing loss.   Eyes: Negative for blurred vision and double vision.  Respiratory: Negative for cough and shortness of breath.   Cardiovascular: Negative for chest pain, palpitations and leg swelling.  Gastrointestinal: Negative for abdominal pain and heartburn.  Genitourinary: Negative for urgency.  Musculoskeletal: Negative for myalgias.  Neurological: Negative for dizziness and weakness.   Psychiatric/Behavioral: The patient is nervous/anxious.      EKGs/Labs/Other Studies Reviewed:    The following studies were reviewed today: TTE: 08/2014  - Left ventricle: The cavity size was normal. Wall thickness was normal. Systolic function was normal. The estimated ejection fraction was in the range of 55% to 60%. Wall motion was normal; there were no regional wall motion abnormalities. Doppler parameters are consistent with abnormal left ventricular relaxation (grade 1 diastolic dysfunction). The E/e&' ratio is between 8-15, suggesting indeterminate LV filling pressure. - Mitral valve: Calcified annulus. There was trivial regurgitation. - Left atrium: The atrium was normal in size. - Tricuspid valve: There was mild regurgitation. - Pulmonary arteries: PA peak pressure: 40 mm Hg (S).  Impressions: - LVEF 55-60%, normal wall thickness, diastolic dysfunction, indeterminate LV filling pressure, normal LA size, mild TR, RVSP 40 mmHg.  TTE: 04/24/2019  1. Normal LV systolic function; grade 1 diastolic dysfunction; mild LVH.  2. Left ventricular ejection fraction, by estimation, is 60 to 65%. The  left ventricle has normal function. The left ventricle has no regional  wall motion abnormalities. There is mild left ventricular hypertrophy.  Left ventricular diastolic parameters  are consistent with Grade I diastolic dysfunction (impaired relaxation).  3. Right ventricular systolic function is normal. The right ventricular  size is normal. Tricuspid regurgitation signal is inadequate for assessing  PA pressure.  4. The mitral valve is normal in structure. Trivial mitral valve  regurgitation. No evidence of mitral stenosis.  5. The aortic valve is tricuspid. Aortic valve regurgitation is not  visualized. No aortic stenosis is present.  6. The inferior vena cava is normal in size with greater than 50%  respiratory variability, suggesting right atrial  pressure of 3 mmHg.   EKG:   05/17/20:   Recent Labs: No results found for requested labs within last 8760 hours.  Recent Lipid Panel No results found for: CHOL, TRIG, HDL, CHOLHDL, VLDL, LDLCALC, LDLDIRECT   Risk Assessment/Calculations:       Physical Exam:    VS:  BP (!) 144/90   Pulse 60   Ht 5\' 2"  (1.575 m)   Wt 160 lb 9.6 oz (72.8 kg)   SpO2 97%   BMI 29.37 kg/m     Wt Readings from Last 3 Encounters:  05/17/20 160 lb 9.6 oz (72.8 kg)  05/09/19 164 lb 3.2 oz (74.5 kg)  04/11/18 163 lb 6.4 oz (74.1 kg)     GEN: Well nourished, well developed in no acute distress HEENT: Normal NECK: No JVD; No carotid bruits LYMPHATICS: No lymphadenopathy CARDIAC: RRR, 2/6 systolic murmur at LUSB, no rubs, no gallops RESPIRATORY:  Clear to auscultation without rales, wheezing or rhonchi  ABDOMEN: Soft, non-tender, non-distended MUSCULOSKELETAL:  No edema; No deformity  SKIN: Warm and dry NEUROLOGIC:  Alert and oriented x 3 PSYCHIATRIC:  Normal affect   ASSESSMENT:    1. AVNRT (AV nodal re-entry tachycardia) ([place])   2. Essential hypertension   3. Hyperlipidemia,  unspecified hyperlipidemia type    PLAN:    In order of problems listed above:  #AVNRT: S/p ablation with Dr. Rayann Heman with resolution of symptoms. Not on any nodal agents. -Continue to monitor -Not on nodal agents  #HTN: Borderline at home. Monitoring with PCP. May consider increasing dose of medication. -Continue benzapril-HCTZ  -Follow-up with PCP -Low Na diet  #HLD: LDL 98 on 05/25/19. -Continue pravastatin 80mg  daily -Repeat lipids per PCP   Medication Adjustments/Labs and Tests Ordered: Current medicines are reviewed at length with the patient today.  Concerns regarding medicines are outlined above.  Orders Placed This Encounter  Procedures  . EKG 12-Lead   No orders of the defined types were placed in this encounter.   Patient Instructions  Medication Instructions:   Your physician  recommends that you continue on your current medications as directed. Please refer to the Current Medication list given to you today.  *If you need a refill on your cardiac medications before your next appointment, please call your pharmacy*    Follow-Up: At [facility], you and your health needs are our priority.  As part of our continuing mission to provide you with exceptional heart care, we have created designated Provider Care Teams.  These Care Teams include your primary Cardiologist (physician) and Advanced Practice Providers (APPs -  Physician Assistants and Nurse Practitioners) who all work together to provide you with the care you need, when you need it.  We recommend signing up for the patient portal called "MyChart".  Sign up information is provided on this After Visit Summary.  MyChart is used to connect with patients for Virtual Visits (Telemedicine).  Patients are able to view lab/test results, encounter notes, upcoming appointments, etc.  Non-urgent messages can be sent to your provider as well.   To learn more about what you can do with MyChart, go to [email or web address].    Your next appointment:   1 year(s)  The format for your next appointment:   In Person  Provider:   Gwyndolyn Kaufman, MD        Follow-up in 1 year.  I,Mathew Stumpf,acting as a Education administrator for Freada Bergeron, MD.,have documented all relevant documentation on the behalf of Freada Bergeron, MD,as directed by  Freada Bergeron, MD while in the presence of Freada Bergeron, MD.  I, Freada Bergeron, MD, have reviewed all documentation for this visit. The documentation on 05/17/20 for the exam, diagnosis, procedures, and orders are all accurate and complete.  Signed, Freada Bergeron, MD  05/17/2020 12:50 PM    [place]

## 2020-05-17 NOTE — Patient Instructions (Signed)

## 2020-06-04 DIAGNOSIS — I10 Essential (primary) hypertension: Secondary | ICD-10-CM | POA: Diagnosis not present

## 2020-06-04 DIAGNOSIS — E785 Hyperlipidemia, unspecified: Secondary | ICD-10-CM | POA: Diagnosis not present

## 2020-06-04 DIAGNOSIS — E559 Vitamin D deficiency, unspecified: Secondary | ICD-10-CM | POA: Diagnosis not present

## 2020-06-04 DIAGNOSIS — Z0000 Encounter for general adult medical examination without abnormal findings: Secondary | ICD-10-CM | POA: Diagnosis not present

## 2020-08-20 DIAGNOSIS — Z4689 Encounter for fitting and adjustment of other specified devices: Secondary | ICD-10-CM | POA: Diagnosis not present

## 2020-08-20 DIAGNOSIS — Z01419 Encounter for gynecological examination (general) (routine) without abnormal findings: Secondary | ICD-10-CM | POA: Diagnosis not present

## 2020-11-12 DIAGNOSIS — R928 Other abnormal and inconclusive findings on diagnostic imaging of breast: Secondary | ICD-10-CM | POA: Diagnosis not present

## 2020-12-05 DIAGNOSIS — I10 Essential (primary) hypertension: Secondary | ICD-10-CM | POA: Diagnosis not present

## 2020-12-05 DIAGNOSIS — E785 Hyperlipidemia, unspecified: Secondary | ICD-10-CM | POA: Diagnosis not present

## 2020-12-05 DIAGNOSIS — Z23 Encounter for immunization: Secondary | ICD-10-CM | POA: Diagnosis not present

## 2020-12-05 DIAGNOSIS — F419 Anxiety disorder, unspecified: Secondary | ICD-10-CM | POA: Diagnosis not present

## 2020-12-11 DIAGNOSIS — L821 Other seborrheic keratosis: Secondary | ICD-10-CM | POA: Diagnosis not present

## 2020-12-11 DIAGNOSIS — L820 Inflamed seborrheic keratosis: Secondary | ICD-10-CM | POA: Diagnosis not present

## 2020-12-11 DIAGNOSIS — Z86018 Personal history of other benign neoplasm: Secondary | ICD-10-CM | POA: Diagnosis not present

## 2020-12-11 DIAGNOSIS — D225 Melanocytic nevi of trunk: Secondary | ICD-10-CM | POA: Diagnosis not present

## 2020-12-11 DIAGNOSIS — L814 Other melanin hyperpigmentation: Secondary | ICD-10-CM | POA: Diagnosis not present

## 2020-12-17 DIAGNOSIS — H52223 Regular astigmatism, bilateral: Secondary | ICD-10-CM | POA: Diagnosis not present

## 2021-01-14 DIAGNOSIS — H524 Presbyopia: Secondary | ICD-10-CM | POA: Diagnosis not present

## 2021-03-25 DIAGNOSIS — M9902 Segmental and somatic dysfunction of thoracic region: Secondary | ICD-10-CM | POA: Diagnosis not present

## 2021-03-25 DIAGNOSIS — M9903 Segmental and somatic dysfunction of lumbar region: Secondary | ICD-10-CM | POA: Diagnosis not present

## 2021-03-25 DIAGNOSIS — M9901 Segmental and somatic dysfunction of cervical region: Secondary | ICD-10-CM | POA: Diagnosis not present

## 2021-03-26 DIAGNOSIS — M9902 Segmental and somatic dysfunction of thoracic region: Secondary | ICD-10-CM | POA: Diagnosis not present

## 2021-03-26 DIAGNOSIS — M9903 Segmental and somatic dysfunction of lumbar region: Secondary | ICD-10-CM | POA: Diagnosis not present

## 2021-03-26 DIAGNOSIS — M9901 Segmental and somatic dysfunction of cervical region: Secondary | ICD-10-CM | POA: Diagnosis not present

## 2021-03-31 DIAGNOSIS — M9903 Segmental and somatic dysfunction of lumbar region: Secondary | ICD-10-CM | POA: Diagnosis not present

## 2021-03-31 DIAGNOSIS — M9901 Segmental and somatic dysfunction of cervical region: Secondary | ICD-10-CM | POA: Diagnosis not present

## 2021-03-31 DIAGNOSIS — M9902 Segmental and somatic dysfunction of thoracic region: Secondary | ICD-10-CM | POA: Diagnosis not present

## 2021-04-02 DIAGNOSIS — M9903 Segmental and somatic dysfunction of lumbar region: Secondary | ICD-10-CM | POA: Diagnosis not present

## 2021-04-02 DIAGNOSIS — M9901 Segmental and somatic dysfunction of cervical region: Secondary | ICD-10-CM | POA: Diagnosis not present

## 2021-04-02 DIAGNOSIS — M9902 Segmental and somatic dysfunction of thoracic region: Secondary | ICD-10-CM | POA: Diagnosis not present

## 2021-04-07 DIAGNOSIS — M9901 Segmental and somatic dysfunction of cervical region: Secondary | ICD-10-CM | POA: Diagnosis not present

## 2021-04-07 DIAGNOSIS — M9903 Segmental and somatic dysfunction of lumbar region: Secondary | ICD-10-CM | POA: Diagnosis not present

## 2021-04-07 DIAGNOSIS — M9902 Segmental and somatic dysfunction of thoracic region: Secondary | ICD-10-CM | POA: Diagnosis not present

## 2021-04-09 DIAGNOSIS — M9903 Segmental and somatic dysfunction of lumbar region: Secondary | ICD-10-CM | POA: Diagnosis not present

## 2021-04-09 DIAGNOSIS — M9901 Segmental and somatic dysfunction of cervical region: Secondary | ICD-10-CM | POA: Diagnosis not present

## 2021-04-09 DIAGNOSIS — M9902 Segmental and somatic dysfunction of thoracic region: Secondary | ICD-10-CM | POA: Diagnosis not present

## 2021-04-14 DIAGNOSIS — M9903 Segmental and somatic dysfunction of lumbar region: Secondary | ICD-10-CM | POA: Diagnosis not present

## 2021-04-14 DIAGNOSIS — M9901 Segmental and somatic dysfunction of cervical region: Secondary | ICD-10-CM | POA: Diagnosis not present

## 2021-04-14 DIAGNOSIS — M9902 Segmental and somatic dysfunction of thoracic region: Secondary | ICD-10-CM | POA: Diagnosis not present

## 2021-04-21 DIAGNOSIS — M9902 Segmental and somatic dysfunction of thoracic region: Secondary | ICD-10-CM | POA: Diagnosis not present

## 2021-04-21 DIAGNOSIS — M9901 Segmental and somatic dysfunction of cervical region: Secondary | ICD-10-CM | POA: Diagnosis not present

## 2021-04-21 DIAGNOSIS — M9903 Segmental and somatic dysfunction of lumbar region: Secondary | ICD-10-CM | POA: Diagnosis not present

## 2021-04-29 DIAGNOSIS — M9901 Segmental and somatic dysfunction of cervical region: Secondary | ICD-10-CM | POA: Diagnosis not present

## 2021-04-29 DIAGNOSIS — M9902 Segmental and somatic dysfunction of thoracic region: Secondary | ICD-10-CM | POA: Diagnosis not present

## 2021-04-29 DIAGNOSIS — M9903 Segmental and somatic dysfunction of lumbar region: Secondary | ICD-10-CM | POA: Diagnosis not present

## 2021-05-05 NOTE — Progress Notes (Deleted)
?Cardiology Office Note:   ? ?Date:  05/05/2021  ? ?ID:  Anna Dawson, DOB 01-Jul-1940, MRN [id number] ? ?PCP:  Harlan Stains, Anna Dawson ?  ?[place]  ?Cardiologist:  Ena Dawley, Anna Dawson  ?Advanced Practice Provider:  No care team member to display ?Electrophysiologist:  None  ? ? ?Referring Anna Dawson: Harlan Stains, Anna Dawson  ? ? ? ?History of Present Illness:   ? ?Anna Dawson is a 81 y.o. 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 Meda Coffee now returning to clinic for follow-up. ? ?Last seen in clinic on 04/2020 where she was doing well with no significant CV symptoms. ? ?Today, *** ? ? ?Past Medical History:  ?Diagnosis Date  ? Arthritis   ? right knee  ? Cataracts, bilateral 1994  ? GERD (gastroesophageal reflux disease)   ? Hyperlipidemia   ? Hypertension   ? Seasonal allergies   ? Sick sinus syndrome ([place])   ? SVT (supraventricular tachycardia) ([place])   ? ? ?Past Surgical History:  ?Procedure Laterality Date  ? CATARACT EXTRACTION, BILATERAL  1994  ? COLONOSCOPY  2006  ? EYELID SURGERY  2006  ? BILATERAL  ? KNEE ARTHROSCOPY  2011  ? RIGHT KNEE  ? RETINAL LASER PROCEDURE  1995  ? SUPRAVENTRICULAR TACHYCARDIA ABLATION N/A 02/23/2014  ? Procedure: SUPRAVENTRICULAR TACHYCARDIA ABLATION;  Surgeon: Thompson Grayer, Anna Dawson;  Location[facility] CATH LAB;  Service: Cardiovascular;  Laterality: N/A;  ? TUBAL LIGATION    ? ? ?Current Medications: ?No outpatient medications have been marked as taking for the 05/14/21 encounter (Appointment) with Anna Bergeron, Anna Dawson.  ?  ? ?Allergies:   Amlodipine besylate  ? ?Social History  ? ?Socioeconomic History  ? Marital status: Divorced  ?  Spouse name: Not on file  ? Number of children: Not on file  ? Years of education: Not on file  ? Highest education level: Not on file  ?Occupational History  ? Not on file  ?Tobacco Use  ? Smoking status: Never  ? Smokeless tobacco: Never  ?Vaping Use  ? Vaping Use: Never  used  ?Substance and Sexual Activity  ? Alcohol use: No  ?  Comment: occasional   ? Drug use: Never  ? Sexual activity: Not on file  ?Other Topics Concern  ? Not on file  ?Social History Narrative  ? Lives along in [place].  Retired.  ? ?Social Determinants of Health  ? ?Financial Resource Strain: Not on file  ?Food Insecurity: Not on file  ?Transportation Needs: Not on file  ?Physical Activity: Not on file  ?Stress: Not on file  ?Social Connections: Not on file  ?  ? ?Family History: ?The patient's family history includes Arrhythmia in her sister; CVA in her father; Colon cancer in her mother; Hypercholesterolemia in her father and sister; Hypertension in her father, mother, and sister; Lung cancer in her sister; Mitral valve prolapse in her sister. ? ?ROS:   ?Please see the history of present illness.    ?Review of Systems  ?Constitutional:  Negative for fever and malaise/fatigue.  ?HENT:  Negative for hearing loss.   ?Eyes:  Negative for blurred vision and double vision.  ?Respiratory:  Negative for cough and shortness of breath.   ?Cardiovascular:  Negative for chest pain, palpitations and leg swelling.  ?Gastrointestinal:  Negative for abdominal pain and heartburn.  ?Genitourinary:  Negative for urgency.  ?Musculoskeletal:  Negative for myalgias.  ?Neurological:  Negative for dizziness and weakness.  ?Psychiatric/Behavioral:  The patient is nervous/anxious.   ? ? ?EKGs/Labs/Other Studies Reviewed:   ? ?The following studies were reviewed today: ?TTE: 08/2014 ? ?- Left ventricle: The cavity size was normal. Wall thickness was ?  normal. Systolic function was normal. The estimated ejection ?  fraction was in the range of 55% to 60%. Wall motion was normal; ?  there were no regional wall motion abnormalities. Doppler ?  parameters are consistent with abnormal left ventricular ?  relaxation (grade 1 diastolic dysfunction). The E/e&' ratio is ?  between 8-15, suggesting indeterminate LV filling pressure. ?- Mitral  valve: Calcified annulus. There was trivial regurgitation. ?- Left atrium: The atrium was normal in size. ?- Tricuspid valve: There was mild regurgitation. ?- Pulmonary arteries: PA peak pressure: 40 mm Hg (S). ? ?Impressions: ?- LVEF 55-60%, normal wall thickness, diastolic dysfunction, ?  indeterminate LV filling pressure, normal LA size, mild TR, RVSP ?  40 mmHg. ?  ?TTE: 04/24/2019 ?  ?1. Normal LV systolic function; grade 1 diastolic dysfunction; mild LVH.  ? 2. Left ventricular ejection fraction, by estimation, is 60 to 65%. The  ?left ventricle has normal function. The left ventricle has no regional  ?wall motion abnormalities. There is mild left ventricular hypertrophy.  ?Left ventricular diastolic parameters  ?are consistent with Grade I diastolic dysfunction (impaired relaxation).  ? 3. Right ventricular systolic function is normal. The right ventricular  ?size is normal. Tricuspid regurgitation signal is inadequate for assessing  ?PA pressure.  ? 4. The mitral valve is normal in structure. Trivial mitral valve  ?regurgitation. No evidence of mitral stenosis.  ? 5. The aortic valve is tricuspid. Aortic valve regurgitation is not  ?visualized. No aortic stenosis is present.  ? 6. The inferior vena cava is normal in size with greater than 50%  ?respiratory variability, suggesting right atrial pressure of 3 mmHg.  ? ?EKG:   ?05/17/20:  ? ?Recent Labs: ?No results found for requested labs within last 8760 hours.  ?Recent Lipid Panel ?No results found for: CHOL, TRIG, HDL, CHOLHDL, VLDL, LDLCALC, LDLDIRECT ? ? ?Risk Assessment/Calculations:   ?  ? ? ?Physical Exam:   ? ?VS:  There were no vitals taken for this visit.   ? ?Wt Readings from Last 3 Encounters:  ?05/17/20 160 lb 9.6 oz (72.8 kg)  ?05/09/19 164 lb 3.2 oz (74.5 kg)  ?04/11/18 163 lb 6.4 oz (74.1 kg)  ?  ? ?GEN: Well nourished, well developed in no acute distress ?HEENT: Normal ?NECK: No JVD; No carotid bruits ?LYMPHATICS: No lymphadenopathy ?CARDIAC:  RRR, 2/6 systolic murmur at LUSB, no rubs, no gallops ?RESPIRATORY:  Clear to auscultation without rales, wheezing or rhonchi  ?ABDOMEN: Soft, non-tender, non-distended ?MUSCULOSKELETAL:  No edema; No deformity  ?SKIN: Warm and dry ?NEUROLOGIC:  Alert and oriented x 3 ?PSYCHIATRIC:  Normal affect  ? ?ASSESSMENT:   ? ?No diagnosis found. ? ?PLAN:   ? ?In order of problems listed above: ? ?#AVNRT: ?S/p ablation with Dr. Rayann Heman with resolution of symptoms. Not on any nodal agents. ?-Continue to monitor ?-Not on nodal agents ? ?#HTN: ?Borderline at home. Monitoring with PCP. May consider increasing dose of medication. ?-Continue benzapril-HCTZ  ?-Follow-up with PCP ?-Low Na diet ? ?#HLD: ?LDL 98 on 05/25/19. ?-Continue pravastatin '80mg'$  daily ?-Repeat lipids per PCP ? ? ?Medication Adjustments/Labs and Tests Ordered: ?Current medicines are reviewed at length with the patient today.  Concerns regarding medicines are outlined above.  ?No orders  of the defined types were placed in this encounter. ? ?No orders of the defined types were placed in this encounter. ? ? ?There are no Patient Instructions on file for this visit. ?  ? ?Follow-up in 1 year. ? ?I,Mathew Stumpf,acting as a Education administrator for Anna Bergeron, Anna Dawson.,have documented all relevant documentation on the behalf of Anna Bergeron, Anna Dawson,as directed by  Anna Bergeron, Anna Dawson while in the presence of Anna Bergeron, Anna Dawson. ? ?I, Anna Bergeron, Anna Dawson, have reviewed all documentation for this visit. The documentation on 05/05/21 for the exam, diagnosis, procedures, and orders are all accurate and complete. ? ?Signed, ?Anna Bergeron, Anna Dawson  ?05/05/2021 8:19 PM    ?[place] ?

## 2021-05-13 NOTE — Progress Notes (Incomplete)
?Cardiology Office Note:   ? ?Date:  05/13/2021  ? ?ID:  Anna Dawson, DOB Sep 23, 1940, MRN [id number] ? ?PCP:  Harlan Stains, MD ?  ?[place]  ?Cardiologist:  Ena Dawley, MD  ?Advanced Practice Provider:  No care team member to display ?Electrophysiologist:  None  ? ? ?Referring MD: Harlan Stains, MD  ? ? ? ?History of Present Illness:   ? ?Anna Dawson is a 81 y.o. 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 Meda Coffee now returning to clinic for follow-up. ? ?Last saw Dr. Meda Coffee on 05/09/19 where she was doing well. No palpitations. Remained active. ? ?At her last appointment, she was doing well from a CV standpoint and staying active.  ? ?Today, *** ? ?The patient denies chest pain, chest pressure, dyspnea at rest or with exertion, palpitations, PND, orthopnea, or leg swelling. Denies cough, fever, chills. Denies nausea, vomiting. Denies syncope or presyncope. Denies dizziness or lightheadedness. Denies snoring. ? ?Past Medical History:  ?Diagnosis Date  ? Arthritis   ? right knee  ? Cataracts, bilateral 1994  ? GERD (gastroesophageal reflux disease)   ? Hyperlipidemia   ? Hypertension   ? Seasonal allergies   ? Sick sinus syndrome ([place])   ? SVT (supraventricular tachycardia) ([place])   ? ? ?Past Surgical History:  ?Procedure Laterality Date  ? CATARACT EXTRACTION, BILATERAL  1994  ? COLONOSCOPY  2006  ? EYELID SURGERY  2006  ? BILATERAL  ? KNEE ARTHROSCOPY  2011  ? RIGHT KNEE  ? RETINAL LASER PROCEDURE  1995  ? SUPRAVENTRICULAR TACHYCARDIA ABLATION N/A 02/23/2014  ? Procedure: SUPRAVENTRICULAR TACHYCARDIA ABLATION;  Surgeon: Thompson Grayer, MD;  Location: [facility] CATH LAB;  Service: Cardiovascular;  Laterality: N/A;  ? TUBAL LIGATION    ? ? ?Current Medications: ?No outpatient medications have been marked as taking for the 05/14/21 encounter (Appointment) with Freada Bergeron, MD.  ?  ? ?Allergies:   Amlodipine  besylate  ? ?Social History  ? ?Socioeconomic History  ? Marital status: Divorced  ?  Spouse name: Not on file  ? Number of children: Not on file  ? Years of education: Not on file  ? Highest education level: Not on file  ?Occupational History  ? Not on file  ?Tobacco Use  ? Smoking status: Never  ? Smokeless tobacco: Never  ?Vaping Use  ? Vaping Use: Never used  ?Substance and Sexual Activity  ? Alcohol use: No  ?  Comment: occasional   ? Drug use: Never  ? Sexual activity: Not on file  ?Other Topics Concern  ? Not on file  ?Social History Narrative  ? Lives along in [place].  Retired.  ? ?Social Determinants of Health  ? ?Financial Resource Strain: Not on file  ?Food Insecurity: Not on file  ?Transportation Needs: Not on file  ?Physical Activity: Not on file  ?Stress: Not on file  ?Social Connections: Not on file  ?  ? ?Family History: ?The patient's family history includes Arrhythmia in her sister; CVA in her father; Colon cancer in her mother; Hypercholesterolemia in her father and sister; Hypertension in her father, mother, and sister; Lung cancer in her sister; Mitral valve prolapse in her sister. ? ?ROS:   ?Please see the history of present illness.    ?Review of Systems  ?Constitutional:  Negative for fever and malaise/fatigue.  ?HENT:  Negative for hearing Dawson.   ?Eyes:  Negative  for blurred vision and double vision.  ?Respiratory:  Negative for cough and shortness of breath.   ?Cardiovascular:  Negative for chest pain, palpitations, orthopnea, claudication, leg swelling and PND.  ?Gastrointestinal:  Negative for abdominal pain and heartburn.  ?Genitourinary:  Negative for urgency.  ?Musculoskeletal:  Negative for myalgias.  ?Neurological:  Negative for dizziness and weakness.  ?Psychiatric/Behavioral:  The patient is not nervous/anxious and does not have insomnia.   ? ? ?EKGs/Labs/Other Studies Reviewed:   ? ?The following studies were reviewed today:  ?TTE: 04/24/2019 ?  ?1. Normal LV systolic  function; grade 1 diastolic dysfunction; mild LVH.  ? 2. Left ventricular ejection fraction, by estimation, is 60 to 65%. The  ?left ventricle has normal function. The left ventricle has no regional  ?wall motion abnormalities. There is mild left ventricular hypertrophy.  ?Left ventricular diastolic parameters  ?are consistent with Grade I diastolic dysfunction (impaired relaxation).  ? 3. Right ventricular systolic function is normal. The right ventricular  ?size is normal. Tricuspid regurgitation signal is inadequate for assessing  ?PA pressure.  ? 4. The mitral valve is normal in structure. Trivial mitral valve  ?regurgitation. No evidence of mitral stenosis.  ? 5. The aortic valve is tricuspid. Aortic valve regurgitation is not  ?visualized. No aortic stenosis is present.  ? 6. The inferior vena cava is normal in size with greater than 50%  ?respiratory variability, suggesting right atrial pressure of 3 mmHg.  ? ?TTE: 08/2014 ? ?- Left ventricle: The cavity size was normal. Wall thickness was ?  normal. Systolic function was normal. The estimated ejection ?  fraction was in the range of 55% to 60%. Wall motion was normal; ?  there were no regional wall motion abnormalities. Doppler ?  parameters are consistent with abnormal left ventricular ?  relaxation (grade 1 diastolic dysfunction). The E/e&' ratio is ?  between 8-15, suggesting indeterminate LV filling pressure. ?- Mitral valve: Calcified annulus. There was trivial regurgitation. ?- Left atrium: The atrium was normal in size. ?- Tricuspid valve: There was mild regurgitation. ?- Pulmonary arteries: PA peak pressure: 40 mm Hg (S). ? ?Impressions: ?- LVEF 55-60%, normal wall thickness, diastolic dysfunction, ?  indeterminate LV filling pressure, normal LA size, mild TR, RVSP ?  40 mmHg. ? ?EKG:   ?05/14/21: Sinus ***, rate *** bpm  ? ?Recent Labs: ?No results found for requested labs within last 8760 hours.  ?Recent Lipid Panel ?No results found for: CHOL, TRIG,  HDL, CHOLHDL, VLDL, LDLCALC, LDLDIRECT ? ? ?Risk Assessment/Calculations:   ?  ? ? ?Physical Exam:   ? ?VS:  There were no vitals taken for this visit.   ? ?Wt Readings from Last 3 Encounters:  ?05/17/20 160 lb 9.6 oz (72.8 kg)  ?05/09/19 164 lb 3.2 oz (74.5 kg)  ?04/11/18 163 lb 6.4 oz (74.1 kg)  ?  ? ?GEN: Well nourished, well developed in no acute distress ?HEENT: Normal ?NECK: No JVD; No carotid bruits ?LYMPHATICS: No lymphadenopathy ?CARDIAC: RRR, ***2/6 systolic murmur at LUSB, no rubs, no gallops ?RESPIRATORY:  Clear to auscultation without rales, wheezing or rhonchi  ?ABDOMEN: Soft, non-tender, non-distended ?MUSCULOSKELETAL:  No edema; No deformity  ?SKIN: Warm and dry ?NEUROLOGIC:  Alert and oriented x 3 ?PSYCHIATRIC:  Normal affect  ? ?ASSESSMENT:   ? ?No diagnosis found. ? ?PLAN:   ? ?In order of problems listed above: ? ?#AVNRT: ?S/p ablation with Dr. Rayann Heman with resolution of symptoms. Not on any nodal agents. ?-Continue to monitor ?-Not on  nodal agents ? ?#HTN: ?Borderline at home. Monitoring with PCP. May consider increasing dose of medication. ?-Continue benzapril-HCTZ  ?-Follow-up with PCP ?-Low Na diet ? ?#HLD: ?LDL 98 on 05/25/19. ?-Continue pravastatin '80mg'$  daily ?-Repeat lipids per PCP ? ? ?Medication Adjustments/Labs and Tests Ordered: ?Current medicines are reviewed at length with the patient today.  Concerns regarding medicines are outlined above.  ?No orders of the defined types were placed in this encounter. ? ?No orders of the defined types were placed in this encounter. ? ? ?There are no Patient Instructions on file for this visit. ?  ? ?Follow-up in 1 year. ? ?I,Mykaella Javier,acting as a scribe for Freada Bergeron, MD.,have documented all relevant documentation on the behalf of Freada Bergeron, MD,as directed by  Freada Bergeron, MD while in the presence of Freada Bergeron, MD. ? ?*** ? ?Signed, ?Mykaella Garlon Hatchet  ?05/13/2021 8:47 AM    ?[place] ?

## 2021-05-14 ENCOUNTER — Ambulatory Visit: Payer: Medicare Other | Admitting: Cardiology

## 2021-05-14 ENCOUNTER — Encounter: Payer: Self-pay | Admitting: Cardiology

## 2021-05-14 VITALS — BP 132/80 | HR 66 | Ht 62.0 in | Wt 157.0 lb

## 2021-05-14 DIAGNOSIS — R002 Palpitations: Secondary | ICD-10-CM | POA: Diagnosis not present

## 2021-05-14 DIAGNOSIS — I471 Supraventricular tachycardia: Secondary | ICD-10-CM | POA: Diagnosis not present

## 2021-05-14 DIAGNOSIS — I10 Essential (primary) hypertension: Secondary | ICD-10-CM

## 2021-05-14 DIAGNOSIS — E785 Hyperlipidemia, unspecified: Secondary | ICD-10-CM | POA: Diagnosis not present

## 2021-05-14 NOTE — Patient Instructions (Signed)
Medication Instructions:  ?Your physician recommends that you continue on your current medications as directed. Please refer to the Current Medication list given to you today. ? ?*If you need a refill on your cardiac medications before your next appointment, please call your pharmacy* ? ? ?Lab Work: ?NONE ? ?If you have labs (blood work) drawn today and your tests are completely normal, you will receive your results only by: ?MyChart Message (if you have MyChart) OR ?A paper copy in the mail ?If you have any lab test that is abnormal or we need to change your treatment, we will call you to review the results. ? ? ?Testing/Procedures: ?NONE ? ?Follow-Up: ?At [facility], you and your health needs are our priority.  As part of our continuing mission to provide you with exceptional heart care, we have created designated Provider Care Teams.  These Care Teams include your primary Cardiologist (physician) and Advanced Practice Providers (APPs -  Physician Assistants and Nurse Practitioners) who all work together to provide you with the care you need, when you need it. ? ?We recommend signing up for the patient portal called "MyChart".  Sign up information is provided on this After Visit Summary.  MyChart is used to connect with patients for Virtual Visits (Telemedicine).  Patients are able to view lab/test results, encounter notes, upcoming appointments, etc.  Non-urgent messages can be sent to your provider as well.   ?To learn more about what you can do with MyChart, go to [email or web address].   ? ?Your next appointment:   ?12 month(s) ? ?The format for your next appointment:   ?In Person ? ?Provider:   ?Gwyndolyn Kaufman, MD ? ?Important Information About [employer] ? ? ? ? ?  ?

## 2021-05-14 NOTE — Progress Notes (Signed)
?Cardiology Office Note:   ? ?Date:  05/14/2021  ? ?ID:  Anna Dawson, DOB [birth year], MRN [id number] ? ?PCP:  Harlan Stains, MD ?  ?[place]  ?Cardiologist:  Ena Dawley, MD  ?Advanced Practice Provider:  No care team member to display ?Electrophysiologist:  None  ? ? ?Referring MD: Harlan Stains, MD  ? ? ?History of Present Illness:   ? ?Anna Dawson is a 81 y.o. 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 Meda Coffee now returning to clinic for follow-up. ? ?Last seen in clinic on 04/2020 where she was doing well with no significant CV symptoms. ? ?Today, she is doing well. She is tolerating pravastatin with no myalgias. However, she endorses pain down her lateral legs which she relates to sciatic pain. The pain occurs primarily while walking. She wonders if Co-Q10 can help with her leg pain. Her leg pain limits her activity, but, she tries to walk when she can. She denies chest pain, chest pressure, dyspnea at rest or with exertion, PND, orthopnea, or leg swelling.  ? ?She does notice occasional brief episodes of heart racing that occur regardless of exertion. However, the palpitations do not bother or limit her.  ? ?Past Medical History:  ?Diagnosis Date  ? Arthritis   ? right knee  ? Cataracts, bilateral 1994  ? GERD (gastroesophageal reflux disease)   ? Hyperlipidemia   ? Hypertension   ? Seasonal allergies   ? Sick sinus syndrome ([place])   ? SVT (supraventricular tachycardia) ([place])   ? ? ?Past Surgical History:  ?Procedure Laterality Date  ? CATARACT EXTRACTION, BILATERAL  1994  ? COLONOSCOPY  2006  ? EYELID SURGERY  2006  ? BILATERAL  ? KNEE ARTHROSCOPY  2011  ? RIGHT KNEE  ? RETINAL LASER PROCEDURE  1995  ? SUPRAVENTRICULAR TACHYCARDIA ABLATION N/A 02/23/2014  ? Procedure: SUPRAVENTRICULAR TACHYCARDIA ABLATION;  Surgeon: Thompson Grayer, MD;  Location: [facility] CATH LAB;  Service: Cardiovascular;  Laterality: N/A;  ?  TUBAL LIGATION    ? ? ?Current Medications: ?Current Meds  ?Medication Sig  ? benazepril-hydrochlorthiazide (LOTENSIN HCT) 20-12.5 MG tablet Take 0.5 tablets by mouth daily.  ? Lactobacillus-Inulin (CULTURELLE DIGESTIVE DAILY PO)   ? OVER THE COUNTER MEDICATION Apply 1 application topically daily as needed (stress relief/ sleep). Essential Oil  ? pravastatin (PRAVACHOL) 80 MG tablet Take 1 tablet (80 mg total) by mouth daily.  ? triamcinolone cream (KENALOG) 0.1 % Apply topically 2 (two) times daily.  ?  ? ?Allergies:   Amlodipine besylate  ? ?Social History  ? ?Socioeconomic History  ? Marital status: Divorced  ?  Spouse name: Not on file  ? Number of children: Not on file  ? Years of education: Not on file  ? Highest education level: Not on file  ?Occupational History  ? Not on file  ?Tobacco Use  ? Smoking status: Never  ? Smokeless tobacco: Never  ?Vaping Use  ? Vaping Use: Never used  ?Substance and Sexual Activity  ? Alcohol use: No  ?  Comment: occasional   ? Drug use: Never  ? Sexual activity: Not on file  ?Other Topics Concern  ? Not on file  ?Social History Narrative  ? Lives along in [place].  Retired.  ? ?Social Determinants of Health  ? ?Financial Resource Strain: Not on file  ?Food Insecurity: Not on file  ?Transportation Needs: Not on file  ?Physical Activity:  Not on file  ?Stress: Not on file  ?Social Connections: Not on file  ?  ? ?Family History: ?The patient's family history includes Arrhythmia in her sister; CVA in her father; Colon cancer in her mother; Hypercholesterolemia in her father and sister; Hypertension in her father, mother, and sister; Lung cancer in her sister; Mitral valve prolapse in her sister. ? ?ROS:   ?Please see the history of present illness.    ?Review of Systems  ?Constitutional:  Negative for fever and malaise/fatigue.  ?HENT:  Negative for hearing loss.   ?Eyes:  Negative for blurred vision and double vision.  ?Respiratory:  Negative for cough and shortness of breath.    ?Cardiovascular:  Positive for palpitations. Negative for chest pain, orthopnea, claudication, leg swelling and PND.  ?Gastrointestinal:  Negative for abdominal pain and heartburn.  ?Genitourinary:  Negative for urgency.  ?Musculoskeletal:  Positive for myalgias (bilateral lateral legs).  ?Neurological:  Negative for dizziness and weakness.  ?Psychiatric/Behavioral:  The patient is not nervous/anxious.   ? ? ?EKGs/Labs/Other Studies Reviewed:   ? ?The following studies were reviewed today:  ?TTE: 04/24/2019 ?  ?1. Normal LV systolic function; grade 1 diastolic dysfunction; mild LVH.  ? 2. Left ventricular ejection fraction, by estimation, is 60 to 65%. The  ?left ventricle has normal function. The left ventricle has no regional  ?wall motion abnormalities. There is mild left ventricular hypertrophy.  ?Left ventricular diastolic parameters  ?are consistent with Grade I diastolic dysfunction (impaired relaxation).  ? 3. Right ventricular systolic function is normal. The right ventricular  ?size is normal. Tricuspid regurgitation signal is inadequate for assessing  ?PA pressure.  ? 4. The mitral valve is normal in structure. Trivial mitral valve  ?regurgitation. No evidence of mitral stenosis.  ? 5. The aortic valve is tricuspid. Aortic valve regurgitation is not  ?visualized. No aortic stenosis is present.  ? 6. The inferior vena cava is normal in size with greater than 50%  ?respiratory variability, suggesting right atrial pressure of 3 mmHg.  ? ?TTE: 08/2014 ?- Left ventricle: The cavity size was normal. Wall thickness was ?  normal. Systolic function was normal. The estimated ejection ?  fraction was in the range of 55% to 60%. Wall motion was normal; ?  there were no regional wall motion abnormalities. Doppler ?  parameters are consistent with abnormal left ventricular ?  relaxation (grade 1 diastolic dysfunction). The E/e&' ratio is ?  between 8-15, suggesting indeterminate LV filling pressure. ?- Mitral valve:  Calcified annulus. There was trivial regurgitation. ?- Left atrium: The atrium was normal in size. ?- Tricuspid valve: There was mild regurgitation. ?- Pulmonary arteries: PA peak pressure: 40 mm Hg (S). ? ?Impressions: ?- LVEF 55-60%, normal wall thickness, diastolic dysfunction, ?  indeterminate LV filling pressure, normal LA size, mild TR, RVSP ?  40 mmHg. ? ?EKG:   ?05/14/21: Sinus rhythm with sinus arrhythmia, rate 66 bpm; low voltage and poor R-wave progression ? ?Recent Labs: ?No results found for requested labs within last 8760 hours.  ?Recent Lipid Panel ?No results found for: CHOL, TRIG, HDL, CHOLHDL, VLDL, LDLCALC, LDLDIRECT ? ? ?Risk Assessment/Calculations:   ?  ? ? ?Physical Exam:   ? ?VS:  BP 132/80   Pulse 66   Ht '5\' 2"'$  (1.575 m)   Wt 157 lb (71.2 kg)   SpO2 95%   BMI 28.72 kg/m?    ? ?Wt Readings from Last 3 Encounters:  ?05/14/21 157 lb (71.2 kg)  ?05/17/20 160  lb 9.6 oz (72.8 kg)  ?05/09/19 164 lb 3.2 oz (74.5 kg)  ?  ? ?GEN: Well nourished, well developed in no acute distress ?HEENT: Normal ?NECK: No JVD; No carotid bruits ?CARDIAC: Irregular, no murmurs, no rubs, no gallops ?RESPIRATORY:  Clear to auscultation without rales, wheezing or rhonchi  ?ABDOMEN: Soft, non-tender, non-distended ?MUSCULOSKELETAL:  No edema; No deformity  ?SKIN: Warm and dry ?NEUROLOGIC:  Alert and oriented x 3 ?PSYCHIATRIC:  Normal affect  ? ?ASSESSMENT:   ? ?1. AVNRT (AV nodal re-entry tachycardia) ([place])   ?2. Essential hypertension   ?3. Hyperlipidemia, unspecified hyperlipidemia type   ?4. Palpitations   ? ? ?PLAN:   ? ?In order of problems listed above: ? ?#AVNRT: ?S/p ablation with Dr. Rayann Heman with resolution of symptoms. Not on any nodal agents. ?-Continue to monitor ?-Not on nodal agents ? ?#Occasional Palpitations: ?Patient with sinus arrhythmia with single PVC on ECG today with irregular pulse on exam. She feels well with rare palpitations. No evidence of Afib on ECG. Offered cardiac monitor to assess burden  of PVCs/presence of ectopy, however, she declined as she feels well and the cardiac monitor in the past caused significant anxiety. Will continue to follow-up. ? ?#HTN: ?Borderline at home. Monitoring with

## 2021-05-19 DIAGNOSIS — M9902 Segmental and somatic dysfunction of thoracic region: Secondary | ICD-10-CM | POA: Diagnosis not present

## 2021-05-19 DIAGNOSIS — M9901 Segmental and somatic dysfunction of cervical region: Secondary | ICD-10-CM | POA: Diagnosis not present

## 2021-05-19 DIAGNOSIS — M9903 Segmental and somatic dysfunction of lumbar region: Secondary | ICD-10-CM | POA: Diagnosis not present

## 2021-06-20 DIAGNOSIS — E785 Hyperlipidemia, unspecified: Secondary | ICD-10-CM | POA: Diagnosis not present

## 2021-06-20 DIAGNOSIS — I10 Essential (primary) hypertension: Secondary | ICD-10-CM | POA: Diagnosis not present

## 2021-06-20 DIAGNOSIS — M8588 Other specified disorders of bone density and structure, other site: Secondary | ICD-10-CM | POA: Diagnosis not present

## 2021-06-20 DIAGNOSIS — Z0000 Encounter for general adult medical examination without abnormal findings: Secondary | ICD-10-CM | POA: Diagnosis not present

## 2021-07-14 ENCOUNTER — Encounter: Payer: Self-pay | Admitting: Gastroenterology

## 2021-07-15 DIAGNOSIS — M9902 Segmental and somatic dysfunction of thoracic region: Secondary | ICD-10-CM | POA: Diagnosis not present

## 2021-07-15 DIAGNOSIS — M9903 Segmental and somatic dysfunction of lumbar region: Secondary | ICD-10-CM | POA: Diagnosis not present

## 2021-07-15 DIAGNOSIS — M9901 Segmental and somatic dysfunction of cervical region: Secondary | ICD-10-CM | POA: Diagnosis not present

## 2021-08-06 DIAGNOSIS — M9902 Segmental and somatic dysfunction of thoracic region: Secondary | ICD-10-CM | POA: Diagnosis not present

## 2021-08-06 DIAGNOSIS — M9901 Segmental and somatic dysfunction of cervical region: Secondary | ICD-10-CM | POA: Diagnosis not present

## 2021-08-06 DIAGNOSIS — M9903 Segmental and somatic dysfunction of lumbar region: Secondary | ICD-10-CM | POA: Diagnosis not present

## 2021-08-21 DIAGNOSIS — Z4689 Encounter for fitting and adjustment of other specified devices: Secondary | ICD-10-CM | POA: Diagnosis not present

## 2021-09-05 DIAGNOSIS — M654 Radial styloid tenosynovitis [de Quervain]: Secondary | ICD-10-CM | POA: Diagnosis not present

## 2021-09-08 DIAGNOSIS — M654 Radial styloid tenosynovitis [de Quervain]: Secondary | ICD-10-CM | POA: Diagnosis not present

## 2021-09-08 DIAGNOSIS — M25531 Pain in right wrist: Secondary | ICD-10-CM | POA: Diagnosis not present

## 2021-09-08 DIAGNOSIS — L0390 Cellulitis, unspecified: Secondary | ICD-10-CM | POA: Diagnosis not present

## 2021-09-12 DIAGNOSIS — M778 Other enthesopathies, not elsewhere classified: Secondary | ICD-10-CM | POA: Diagnosis not present

## 2021-09-19 DIAGNOSIS — M25531 Pain in right wrist: Secondary | ICD-10-CM | POA: Diagnosis not present

## 2021-11-18 DIAGNOSIS — Z1231 Encounter for screening mammogram for malignant neoplasm of breast: Secondary | ICD-10-CM | POA: Diagnosis not present

## 2021-11-25 DIAGNOSIS — R928 Other abnormal and inconclusive findings on diagnostic imaging of breast: Secondary | ICD-10-CM | POA: Diagnosis not present

## 2021-12-05 DIAGNOSIS — F419 Anxiety disorder, unspecified: Secondary | ICD-10-CM | POA: Diagnosis not present

## 2021-12-05 DIAGNOSIS — R350 Frequency of micturition: Secondary | ICD-10-CM | POA: Diagnosis not present

## 2021-12-05 DIAGNOSIS — I10 Essential (primary) hypertension: Secondary | ICD-10-CM | POA: Diagnosis not present

## 2021-12-05 DIAGNOSIS — E785 Hyperlipidemia, unspecified: Secondary | ICD-10-CM | POA: Diagnosis not present

## 2021-12-24 DIAGNOSIS — Z86018 Personal history of other benign neoplasm: Secondary | ICD-10-CM | POA: Diagnosis not present

## 2021-12-24 DIAGNOSIS — D225 Melanocytic nevi of trunk: Secondary | ICD-10-CM | POA: Diagnosis not present

## 2021-12-24 DIAGNOSIS — L814 Other melanin hyperpigmentation: Secondary | ICD-10-CM | POA: Diagnosis not present

## 2021-12-24 DIAGNOSIS — L821 Other seborrheic keratosis: Secondary | ICD-10-CM | POA: Diagnosis not present

## 2022-01-08 DIAGNOSIS — H5213 Myopia, bilateral: Secondary | ICD-10-CM | POA: Diagnosis not present

## 2022-01-21 DIAGNOSIS — H524 Presbyopia: Secondary | ICD-10-CM | POA: Diagnosis not present

## 2022-02-24 DIAGNOSIS — M9901 Segmental and somatic dysfunction of cervical region: Secondary | ICD-10-CM | POA: Diagnosis not present

## 2022-02-24 DIAGNOSIS — M9902 Segmental and somatic dysfunction of thoracic region: Secondary | ICD-10-CM | POA: Diagnosis not present

## 2022-02-24 DIAGNOSIS — M9903 Segmental and somatic dysfunction of lumbar region: Secondary | ICD-10-CM | POA: Diagnosis not present

## 2022-03-17 DIAGNOSIS — M9902 Segmental and somatic dysfunction of thoracic region: Secondary | ICD-10-CM | POA: Diagnosis not present

## 2022-03-17 DIAGNOSIS — M9903 Segmental and somatic dysfunction of lumbar region: Secondary | ICD-10-CM | POA: Diagnosis not present

## 2022-03-17 DIAGNOSIS — M9901 Segmental and somatic dysfunction of cervical region: Secondary | ICD-10-CM | POA: Diagnosis not present

## 2022-04-06 DIAGNOSIS — M9902 Segmental and somatic dysfunction of thoracic region: Secondary | ICD-10-CM | POA: Diagnosis not present

## 2022-04-06 DIAGNOSIS — M9901 Segmental and somatic dysfunction of cervical region: Secondary | ICD-10-CM | POA: Diagnosis not present

## 2022-04-06 DIAGNOSIS — M9903 Segmental and somatic dysfunction of lumbar region: Secondary | ICD-10-CM | POA: Diagnosis not present

## 2022-04-15 DIAGNOSIS — K080 Exfoliation of teeth due to systemic causes: Secondary | ICD-10-CM | POA: Diagnosis not present

## 2022-04-28 DIAGNOSIS — M9902 Segmental and somatic dysfunction of thoracic region: Secondary | ICD-10-CM | POA: Diagnosis not present

## 2022-04-28 DIAGNOSIS — M9903 Segmental and somatic dysfunction of lumbar region: Secondary | ICD-10-CM | POA: Diagnosis not present

## 2022-04-28 DIAGNOSIS — M9901 Segmental and somatic dysfunction of cervical region: Secondary | ICD-10-CM | POA: Diagnosis not present

## 2022-05-18 DIAGNOSIS — M9903 Segmental and somatic dysfunction of lumbar region: Secondary | ICD-10-CM | POA: Diagnosis not present

## 2022-05-18 DIAGNOSIS — M9902 Segmental and somatic dysfunction of thoracic region: Secondary | ICD-10-CM | POA: Diagnosis not present

## 2022-05-18 DIAGNOSIS — M9901 Segmental and somatic dysfunction of cervical region: Secondary | ICD-10-CM | POA: Diagnosis not present

## 2022-06-08 DIAGNOSIS — H524 Presbyopia: Secondary | ICD-10-CM | POA: Diagnosis not present

## 2022-06-09 DIAGNOSIS — M9902 Segmental and somatic dysfunction of thoracic region: Secondary | ICD-10-CM | POA: Diagnosis not present

## 2022-06-09 DIAGNOSIS — M9903 Segmental and somatic dysfunction of lumbar region: Secondary | ICD-10-CM | POA: Diagnosis not present

## 2022-06-09 DIAGNOSIS — M9901 Segmental and somatic dysfunction of cervical region: Secondary | ICD-10-CM | POA: Diagnosis not present

## 2022-07-01 DIAGNOSIS — M9903 Segmental and somatic dysfunction of lumbar region: Secondary | ICD-10-CM | POA: Diagnosis not present

## 2022-07-01 DIAGNOSIS — M9902 Segmental and somatic dysfunction of thoracic region: Secondary | ICD-10-CM | POA: Diagnosis not present

## 2022-07-01 DIAGNOSIS — M9901 Segmental and somatic dysfunction of cervical region: Secondary | ICD-10-CM | POA: Diagnosis not present

## 2022-07-09 DIAGNOSIS — I10 Essential (primary) hypertension: Secondary | ICD-10-CM | POA: Diagnosis not present

## 2022-07-09 DIAGNOSIS — E785 Hyperlipidemia, unspecified: Secondary | ICD-10-CM | POA: Diagnosis not present

## 2022-07-09 DIAGNOSIS — Z0000 Encounter for general adult medical examination without abnormal findings: Secondary | ICD-10-CM | POA: Diagnosis not present

## 2022-07-09 DIAGNOSIS — M8588 Other specified disorders of bone density and structure, other site: Secondary | ICD-10-CM | POA: Diagnosis not present

## 2022-07-09 DIAGNOSIS — E559 Vitamin D deficiency, unspecified: Secondary | ICD-10-CM | POA: Diagnosis not present

## 2022-07-09 DIAGNOSIS — F419 Anxiety disorder, unspecified: Secondary | ICD-10-CM | POA: Diagnosis not present

## 2022-07-21 DIAGNOSIS — M9903 Segmental and somatic dysfunction of lumbar region: Secondary | ICD-10-CM | POA: Diagnosis not present

## 2022-07-21 DIAGNOSIS — M9902 Segmental and somatic dysfunction of thoracic region: Secondary | ICD-10-CM | POA: Diagnosis not present

## 2022-07-21 DIAGNOSIS — M9901 Segmental and somatic dysfunction of cervical region: Secondary | ICD-10-CM | POA: Diagnosis not present

## 2022-07-24 DIAGNOSIS — K080 Exfoliation of teeth due to systemic causes: Secondary | ICD-10-CM | POA: Diagnosis not present

## 2022-07-31 NOTE — Progress Notes (Unsigned)
Cardiology Office Note:    Date:  08/03/2022   ID:  Anna Dawson, DOB [birth year], MRN [id number]  PCP:  Laurann Montana, MD   [facility]  Cardiologist:  Tobias Alexander, MD  Advanced Practice Provider:  No care team member to display Electrophysiologist:  None    Referring MD: Laurann Montana, MD    History of Present Illness:    Anna Dawson is a 82 y.o. 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 Delton See now returning to clinic for follow-up.  Last seen in clinic on 04/2021 where she was doing well from a CV standpoint.  Today, the patient overall feels well. No chest pain, SOB, orthopnea, PND or LE edema. Rare palpitations that are very brief and not bothersome. Remains active and walks without exertional symptoms. Blood pressure is well controlled and at goal.   Past Medical History:  Diagnosis Date   Arthritis    right knee   Cataracts, bilateral 1994   GERD (gastroesophageal reflux disease)    Hyperlipidemia    Hypertension    Seasonal allergies    Sick sinus syndrome (HCC)    SVT (supraventricular tachycardia)     Past Surgical History:  Procedure Laterality Date   CATARACT EXTRACTION, BILATERAL  1994   COLONOSCOPY  2006   EYELID SURGERY  2006   BILATERAL   KNEE ARTHROSCOPY  2011   RIGHT KNEE   RETINAL LASER PROCEDURE  1995   SUPRAVENTRICULAR TACHYCARDIA ABLATION N/A 02/23/2014   Procedure: SUPRAVENTRICULAR TACHYCARDIA ABLATION;  Surgeon: Hillis Range, MD;  Location: [facility] CATH LAB;  Service: Cardiovascular;  Laterality: N/A;   TUBAL LIGATION      Current Medications: Current Meds  Medication Sig   benazepril-hydrochlorthiazide (LOTENSIN HCT) 20-12.5 MG tablet Take 0.5 tablets by mouth daily.   Lactobacillus-Inulin (CULTURELLE DIGESTIVE DAILY PO)    OVER THE COUNTER MEDICATION Apply 1 application topically daily as needed (stress relief/ sleep). Essential Oil    pravastatin (PRAVACHOL) 80 MG tablet Take 1 tablet (80 mg total) by mouth daily.   triamcinolone cream (KENALOG) 0.1 % Apply topically 2 (two) times daily.     Allergies:   Amlodipine besylate   Social History   Socioeconomic History   Marital status: Divorced    Spouse name: Not on file   Number of children: Not on file   Years of education: Not on file   Highest education level: Not on file  Occupational History   Not on file  Tobacco Use   Smoking status: Never   Smokeless tobacco: Never  Vaping Use   Vaping Use: Never used  Substance and Sexual Activity   Alcohol use: No    Comment: occasional    Drug use: Never   Sexual activity: Not on file  Other Topics Concern   Not on file  Social History Narrative   Lives along in [place].  Retired.   Social Determinants of Health   Financial Resource Strain: Not on file  Food Insecurity: Not on file  Transportation Needs: Not on file  Physical Activity: Not on file  Stress: Not on file  Social Connections: Not on file     Family History: The patient's family history includes Arrhythmia in her sister; CVA in her father; Colon cancer in her mother; Hypercholesterolemia in her father and sister; Hypertension in her father, mother, and sister; Lung cancer in her sister; Mitral valve prolapse in her  sister.  ROS:   Please see the history of present illness.       EKGs/Labs/Other Studies Reviewed:    The following studies were reviewed today:  Cardiac Studies & Procedures     STRESS TESTS  MYOCARDIAL PERFUSION IMAGING 06/06/2018   ECHOCARDIOGRAM  ECHOCARDIOGRAM COMPLETE 04/24/2019  Narrative ECHOCARDIOGRAM REPORT    Patient Name:   Anna Dawson Date of Exam: 04/24/2019 Medical Rec #:  [id number]      Height:       62.0 in Accession #:    [id number]     Weight:       163.4 lb Date of Birth:  [birth year]      BSA:          1.754 m Patient Age:    78 years       BP:           146/79 mmHg Patient Gender: F               HR:           67 bpm. Exam Location:  [address]  Procedure: 2D Echo, Cardiac Doppler and Color Doppler  Indications:    I27.20 Pulmonary Hypertension  History:        Patient has prior history of Echocardiogram examinations, most recent 08/29/2014. Risk Factors:Hypertension and Dyslipidemia. Supraventricular tachycardia. Sinus brady-tachy syndrome. Sick sinus syndrome.  Sonographer:    Cathie Beams RCS Referring Phys: [id number] Faustino Congress NELSON  IMPRESSIONS   1. Normal LV systolic function; grade 1 diastolic dysfunction; mild LVH. 2. Left ventricular ejection fraction, by estimation, is 60 to 65%. The left ventricle has normal function. The left ventricle has no regional wall motion abnormalities. There is mild left ventricular hypertrophy. Left ventricular diastolic parameters are consistent with Grade I diastolic dysfunction (impaired relaxation). 3. Right ventricular systolic function is normal. The right ventricular size is normal. Tricuspid regurgitation signal is inadequate for assessing PA pressure. 4. The mitral valve is normal in structure. Trivial mitral valve regurgitation. No evidence of mitral stenosis. 5. The aortic valve is tricuspid. Aortic valve regurgitation is not visualized. No aortic stenosis is present. 6. The inferior vena cava is normal in size with greater than 50% respiratory variability, suggesting right atrial pressure of 3 mmHg.  FINDINGS Left Ventricle: Left ventricular ejection fraction, by estimation, is 60 to 65%. The left ventricle has normal function. The left ventricle has no regional wall motion abnormalities. The left ventricular internal cavity size was normal in size. There is mild left ventricular hypertrophy. Left ventricular diastolic parameters are consistent with Grade I diastolic dysfunction (impaired relaxation).  Right Ventricle: The right ventricular size is normal. No increase in right ventricular wall thickness. Right  ventricular systolic function is normal. Tricuspid regurgitation signal is inadequate for assessing PA pressure.  Left Atrium: Left atrial size was normal in size.  Right Atrium: Right atrial size was normal in size.  Pericardium: There is no evidence of pericardial effusion.  Mitral Valve: The mitral valve is normal in structure. Normal mobility of the mitral valve leaflets. Trivial mitral valve regurgitation. No evidence of mitral valve stenosis.  Tricuspid Valve: The tricuspid valve is normal in structure. Tricuspid valve regurgitation is trivial. No evidence of tricuspid stenosis.  Aortic Valve: The aortic valve is tricuspid. Aortic valve regurgitation is not visualized. No aortic stenosis is present.  Pulmonic Valve: The pulmonic valve was normal in structure. Pulmonic valve regurgitation is trivial. No evidence of pulmonic stenosis.  Aorta: The aortic  root is normal in size and structure.  Venous: The inferior vena cava is normal in size with greater than 50% respiratory variability, suggesting right atrial pressure of 3 mmHg.  IAS/Shunts: No atrial level shunt detected by color flow Doppler.  Additional Comments: Normal LV systolic function; grade 1 diastolic dysfunction; mild LVH.   LEFT VENTRICLE PLAX 2D LVIDd:         3.80 cm  Diastology LVIDs:         2.50 cm  LV e' lateral:   4.20 cm/s LV PW:         1.30 cm  LV E/e' lateral: 14.0 LV IVS:        1.50 cm  LV e' medial:    4.43 cm/s LVOT diam:     2.05 cm  LV E/e' medial:  13.3 LV SV:         71 LV SV Index:   40 LVOT Area:     3.30 cm   RIGHT VENTRICLE RV S prime:     14.10 cm/s  LEFT ATRIUM             Index LA diam:        3.70 cm 2.11 cm/m LA Vol (A2C):   23.5 ml 13.40 ml/m LA Vol (A4C):   34.9 ml 19.89 ml/m LA Biplane Vol: 31.7 ml 18.07 ml/m AORTIC VALVE LVOT Vmax:   102.00 cm/s LVOT Vmean:  63.900 cm/s LVOT VTI:    0.215 m  AORTA Ao Root diam: 3.50 cm  MITRAL VALVE MV Area (PHT): 2.24 cm     SHUNTS MV Decel Time: 339 msec    Systemic VTI:  0.22 m MV E velocity: 58.70 cm/s  Systemic Diam: 2.05 cm MV A velocity: 97.70 cm/s MV E/A ratio:  0.60  Olga Millers MD Electronically signed by Olga Millers MD Signature Date/Time: 04/24/2019/2:15:21 PM    Final              EKG:   NSR, PAC, PVC-personally reviewed  Recent Labs: No results found for requested labs within last 365 days.  Recent Lipid Panel No results found for: "CHOL", "TRIG", "HDL", "CHOLHDL", "VLDL", "LDLCALC", "LDLDIRECT"   Risk Assessment/Calculations:       Physical Exam:    VS:  BP 128/80   Pulse 62   Ht 5\' 1"  (1.549 m)   Wt 151 lb 3.2 oz (68.6 kg)   SpO2 95%   BMI 28.57 kg/m     Wt Readings from Last 3 Encounters:  08/03/22 151 lb 3.2 oz (68.6 kg)  05/14/21 157 lb (71.2 kg)  05/17/20 160 lb 9.6 oz (72.8 kg)     GEN: Well nourished, well developed in no acute distress HEENT: Normal NECK: No JVD; No carotid bruits CARDIAC: Irregular, 1/6 systolic murmur RESPIRATORY:  Clear to auscultation without rales, wheezing or rhonchi  ABDOMEN: Soft, non-tender, non-distended MUSCULOSKELETAL:  No edema, warm SKIN: Warm and dry NEUROLOGIC:  Alert and oriented x 3 PSYCHIATRIC:  Normal affect   ASSESSMENT:    1. Sinus brady-tachy syndrome (HCC)   2. AVNRT (AV nodal re-entry tachycardia)   3. Essential hypertension   4. Hyperlipidemia, unspecified hyperlipidemia type   5. Palpitations     PLAN:    In order of problems listed above:  #AVNRT: S/p ablation with Dr. Johney Frame with resolution of symptoms. Not on any nodal agents. -Continue to monitor -Not on nodal agents  #Occasional Palpitations: Patient with known ectopy based on ECG (both PAC and  PVCs) but is overall mostly asymptomatic. Offered cardiac monitor to assess burden of PVCs/presence of ectopy, however, she declined as she feels well and the cardiac monitor in the past caused significant anxiety. EF is normal and patient is  active without symptoms. Will continue to monitor.  #HTN: -Controlled today in 120/80s -Continue benzapril-HCTZ 20-12.5mg  daily -Follow-up with PCP as scheduled -Low Na diet  #HLD: LDL 87. No known CAD. Follows with PCP -Continue pravastatin 80mg  daily -Repeat lipids per PCP   Medication Adjustments/Labs and Tests Ordered: Current medicines are reviewed at length with the patient today.  Concerns regarding medicines are outlined above.  Orders Placed This Encounter  Procedures   EKG 12-Lead   No orders of the defined types were placed in this encounter.   Patient Instructions  Medication Instructions:   Your physician recommends that you continue on your current medications as directed. Please refer to the Current Medication list given to you today.  *If you need a refill on your cardiac medications before your next appointment, please call your pharmacy*    Follow-Up: At [facility], you and your health needs are our priority.  As part of our continuing mission to provide you with exceptional heart care, we have created designated Provider Care Teams.  These Care Teams include your primary Cardiologist (physician) and Advanced Practice Providers (APPs -  Physician Assistants and Nurse Practitioners) who all work together to provide you with the care you need, when you need it.  We recommend signing up for the patient portal called "MyChart".  Sign up information is provided on this After Visit Summary.  MyChart is used to connect with patients for Virtual Visits (Telemedicine).  Patients are able to view lab/test results, encounter notes, upcoming appointments, etc.  Non-urgent messages can be sent to your provider as well.   To learn more about what you can do with MyChart, go to [email or web address].    Your next appointment:   1 year(s)  Provider:   Dr. Jacques Navy      Follow-up in 1 year.   Signed, Meriam Sprague, MD  08/03/2022 12:16 PM    [facility]

## 2022-08-03 ENCOUNTER — Encounter: Payer: Self-pay | Admitting: Cardiology

## 2022-08-03 ENCOUNTER — Ambulatory Visit: Payer: Medicare Other | Attending: Cardiology | Admitting: Cardiology

## 2022-08-03 VITALS — BP 128/80 | HR 62 | Ht 61.0 in | Wt 151.2 lb

## 2022-08-03 DIAGNOSIS — E785 Hyperlipidemia, unspecified: Secondary | ICD-10-CM | POA: Diagnosis not present

## 2022-08-03 DIAGNOSIS — I10 Essential (primary) hypertension: Secondary | ICD-10-CM

## 2022-08-03 DIAGNOSIS — I495 Sick sinus syndrome: Secondary | ICD-10-CM

## 2022-08-03 DIAGNOSIS — I4719 Other supraventricular tachycardia: Secondary | ICD-10-CM | POA: Diagnosis not present

## 2022-08-03 DIAGNOSIS — R002 Palpitations: Secondary | ICD-10-CM

## 2022-08-03 NOTE — Patient Instructions (Signed)
Medication Instructions:   Your physician recommends that you continue on your current medications as directed. Please refer to the Current Medication list given to you today.  *If you need a refill on your cardiac medications before your next appointment, please call your pharmacy*    Follow-Up: At [facility], you and your health needs are our priority.  As part of our continuing mission to provide you with exceptional heart care, we have created designated Provider Care Teams.  These Care Teams include your primary Cardiologist (physician) and Advanced Practice Providers (APPs -  Physician Assistants and Nurse Practitioners) who all work together to provide you with the care you need, when you need it.  We recommend signing up for the patient portal called "MyChart".  Sign up information is provided on this After Visit Summary.  MyChart is used to connect with patients for Virtual Visits (Telemedicine).  Patients are able to view lab/test results, encounter notes, upcoming appointments, etc.  Non-urgent messages can be sent to your provider as well.   To learn more about what you can do with MyChart, go to [email or web address].    Your next appointment:   1 year(s)  Provider:   Dr. Acharya   

## 2022-08-10 DIAGNOSIS — M9902 Segmental and somatic dysfunction of thoracic region: Secondary | ICD-10-CM | POA: Diagnosis not present

## 2022-08-10 DIAGNOSIS — M9901 Segmental and somatic dysfunction of cervical region: Secondary | ICD-10-CM | POA: Diagnosis not present

## 2022-08-10 DIAGNOSIS — M9903 Segmental and somatic dysfunction of lumbar region: Secondary | ICD-10-CM | POA: Diagnosis not present

## 2022-08-26 DIAGNOSIS — Z4689 Encounter for fitting and adjustment of other specified devices: Secondary | ICD-10-CM | POA: Diagnosis not present

## 2022-09-01 DIAGNOSIS — M9901 Segmental and somatic dysfunction of cervical region: Secondary | ICD-10-CM | POA: Diagnosis not present

## 2022-09-01 DIAGNOSIS — M9903 Segmental and somatic dysfunction of lumbar region: Secondary | ICD-10-CM | POA: Diagnosis not present

## 2022-09-01 DIAGNOSIS — M9902 Segmental and somatic dysfunction of thoracic region: Secondary | ICD-10-CM | POA: Diagnosis not present

## 2022-09-22 DIAGNOSIS — M9903 Segmental and somatic dysfunction of lumbar region: Secondary | ICD-10-CM | POA: Diagnosis not present

## 2022-09-22 DIAGNOSIS — M9902 Segmental and somatic dysfunction of thoracic region: Secondary | ICD-10-CM | POA: Diagnosis not present

## 2022-09-22 DIAGNOSIS — M9901 Segmental and somatic dysfunction of cervical region: Secondary | ICD-10-CM | POA: Diagnosis not present

## 2022-10-13 DIAGNOSIS — M9901 Segmental and somatic dysfunction of cervical region: Secondary | ICD-10-CM | POA: Diagnosis not present

## 2022-10-13 DIAGNOSIS — M9902 Segmental and somatic dysfunction of thoracic region: Secondary | ICD-10-CM | POA: Diagnosis not present

## 2022-10-13 DIAGNOSIS — M9903 Segmental and somatic dysfunction of lumbar region: Secondary | ICD-10-CM | POA: Diagnosis not present

## 2022-11-03 DIAGNOSIS — M9901 Segmental and somatic dysfunction of cervical region: Secondary | ICD-10-CM | POA: Diagnosis not present

## 2022-11-03 DIAGNOSIS — M9902 Segmental and somatic dysfunction of thoracic region: Secondary | ICD-10-CM | POA: Diagnosis not present

## 2022-11-03 DIAGNOSIS — M9903 Segmental and somatic dysfunction of lumbar region: Secondary | ICD-10-CM | POA: Diagnosis not present

## 2022-11-16 DIAGNOSIS — K080 Exfoliation of teeth due to systemic causes: Secondary | ICD-10-CM | POA: Diagnosis not present

## 2022-11-24 DIAGNOSIS — Z1231 Encounter for screening mammogram for malignant neoplasm of breast: Secondary | ICD-10-CM | POA: Diagnosis not present

## 2022-12-10 DIAGNOSIS — I10 Essential (primary) hypertension: Secondary | ICD-10-CM | POA: Diagnosis not present

## 2022-12-10 DIAGNOSIS — F419 Anxiety disorder, unspecified: Secondary | ICD-10-CM | POA: Diagnosis not present

## 2022-12-10 DIAGNOSIS — Z23 Encounter for immunization: Secondary | ICD-10-CM | POA: Diagnosis not present

## 2022-12-10 DIAGNOSIS — E785 Hyperlipidemia, unspecified: Secondary | ICD-10-CM | POA: Diagnosis not present

## 2022-12-10 DIAGNOSIS — I495 Sick sinus syndrome: Secondary | ICD-10-CM | POA: Diagnosis not present

## 2022-12-28 DIAGNOSIS — L821 Other seborrheic keratosis: Secondary | ICD-10-CM | POA: Diagnosis not present

## 2022-12-28 DIAGNOSIS — L400 Psoriasis vulgaris: Secondary | ICD-10-CM | POA: Diagnosis not present

## 2022-12-28 DIAGNOSIS — D225 Melanocytic nevi of trunk: Secondary | ICD-10-CM | POA: Diagnosis not present

## 2022-12-28 DIAGNOSIS — L814 Other melanin hyperpigmentation: Secondary | ICD-10-CM | POA: Diagnosis not present

## 2022-12-31 DIAGNOSIS — H524 Presbyopia: Secondary | ICD-10-CM | POA: Diagnosis not present

## 2023-02-08 DIAGNOSIS — K080 Exfoliation of teeth due to systemic causes: Secondary | ICD-10-CM | POA: Diagnosis not present

## 2023-05-24 DIAGNOSIS — K080 Exfoliation of teeth due to systemic causes: Secondary | ICD-10-CM | POA: Diagnosis not present

## 2023-05-26 DIAGNOSIS — K080 Exfoliation of teeth due to systemic causes: Secondary | ICD-10-CM | POA: Diagnosis not present

## 2023-07-29 DIAGNOSIS — E559 Vitamin D deficiency, unspecified: Secondary | ICD-10-CM | POA: Diagnosis not present

## 2023-07-29 DIAGNOSIS — Z0000 Encounter for general adult medical examination without abnormal findings: Secondary | ICD-10-CM | POA: Diagnosis not present

## 2023-07-29 DIAGNOSIS — I495 Sick sinus syndrome: Secondary | ICD-10-CM | POA: Diagnosis not present

## 2023-07-29 DIAGNOSIS — Z23 Encounter for immunization: Secondary | ICD-10-CM | POA: Diagnosis not present

## 2023-07-29 DIAGNOSIS — F419 Anxiety disorder, unspecified: Secondary | ICD-10-CM | POA: Diagnosis not present

## 2023-07-29 DIAGNOSIS — I10 Essential (primary) hypertension: Secondary | ICD-10-CM | POA: Diagnosis not present

## 2023-07-29 DIAGNOSIS — E785 Hyperlipidemia, unspecified: Secondary | ICD-10-CM | POA: Diagnosis not present

## 2023-08-26 DIAGNOSIS — N3941 Urge incontinence: Secondary | ICD-10-CM | POA: Diagnosis not present

## 2023-08-26 DIAGNOSIS — Z4689 Encounter for fitting and adjustment of other specified devices: Secondary | ICD-10-CM | POA: Diagnosis not present

## 2023-09-03 ENCOUNTER — Ambulatory Visit: Attending: Internal Medicine | Admitting: Internal Medicine

## 2023-09-03 ENCOUNTER — Encounter: Payer: Self-pay | Admitting: Internal Medicine

## 2023-09-03 VITALS — BP 142/70 | HR 54 | Ht 62.0 in | Wt 144.0 lb

## 2023-09-03 DIAGNOSIS — I10 Essential (primary) hypertension: Secondary | ICD-10-CM

## 2023-09-03 DIAGNOSIS — R002 Palpitations: Secondary | ICD-10-CM

## 2023-09-03 DIAGNOSIS — I4719 Other supraventricular tachycardia: Secondary | ICD-10-CM

## 2023-09-03 DIAGNOSIS — E785 Hyperlipidemia, unspecified: Secondary | ICD-10-CM

## 2023-09-03 DIAGNOSIS — I495 Sick sinus syndrome: Secondary | ICD-10-CM

## 2023-09-03 NOTE — Patient Instructions (Signed)
 Medication Instructions:  No Changes *If you need a refill on your cardiac medications before your next appointment, please call your pharmacy*  Lab Work: None  Follow-Up: At [facility], you and your health needs are our priority.  As part of our continuing mission to provide you with exceptional heart care, our providers are all part of one team.  This team includes your primary Cardiologist (physician) and Advanced Practice Providers or APPs (Physician Assistants and Nurse Practitioners) who all work together to provide you with the care you need, when you need it.  Your next appointment:   1 year(s)  Provider:   Gayatri A Acharya, MD    Other Instructions Please call us  or send a MyChart message with any Cardiology related questions/concerns.  [phone].  Thank you!

## 2023-09-03 NOTE — Progress Notes (Signed)
  Cardiology Office Note:  .   Date:  09/03/2023  ID:  Anna Dawson, DOB [birth year], MRN [id number] PCP: Teresa Channel, MD  [facility] Providers Cardiologist:  Soyla DELENA Merck, MD    History of Present Illness: .   Anna Dawson is a 83 y.o. female.  Discussed the use of AI scribe software for clinical note transcription with the patient, who gave verbal consent to proceed.  History of Present Illness Anna Dawson is an 83 year old female with AVNRT, status post ablation, hypertension, and hyperlipidemia who presents for a routine cardiovascular follow-up.  She has a history of AVNRT and underwent an ablation procedure, which resolved her symptoms. She experiences rare palpitations since the procedure but no significant issues. No dizziness or lightheadedness recently.  Her hypertension is managed with benazepril  HCTZ 20-12.5 mg, half a tablet daily. Her blood pressure is usually around 130 or lower at home.  She is on pravastatin  80 mg daily for hyperlipidemia, and her cholesterol levels are well controlled.    ROS: negative except per HPI above.  Studies Reviewed: SABRA   EKG Interpretation Date/Time:  Friday September 03 2023 09:49:01 EDT Ventricular Rate:  54 PR Interval:  160 QRS Duration:  108 QT Interval:  410 QTC Calculation: 388 R Axis:   -45  Text Interpretation: Sinus bradycardia with marked sinus arrhythmia vs blocked PAC Possible Left atrial enlargement Low voltage QRS Left anterior fascicular block Nonspecific T wave abnormality When compared with ECG of 03-Aug-2022 11:13, Premature ventricular complexes are no longer Present Confirmed by Terren Haberle ([id number]) on 09/03/2023 10:23:41 AM    Results DIAGNOSTIC EKG: Normal Echocardiogram: Normal systolic function, normal right heart, normal valve function (2021) Risk Assessment/Calculations:       Physical Exam:   VS:  BP (!) 142/70   Pulse (!) 54   Ht 5' 2 (1.575 m)   Wt 144 lb (65.3 kg)    SpO2 98%   BMI 26.34 kg/m    Wt Readings from Last 3 Encounters:  09/03/23 144 lb (65.3 kg)  08/03/22 151 lb 3.2 oz (68.6 kg)  05/14/21 157 lb (71.2 kg)     Physical Exam VITALS: BP- 142/70 GENERAL: Alert, cooperative, well developed, no acute distress HEENT: Normocephalic, normal oropharynx, moist mucous membranes CHEST: Clear to auscultation bilaterally, no wheezes, rhonchi, or crackles CARDIOVASCULAR: Normal heart rate and rhythm, S1 and S2 normal without murmurs ABDOMEN: Soft, non-tender, non-distended, without organomegaly, normal bowel sounds EXTREMITIES: No cyanosis or edema NEUROLOGICAL: Cranial nerves grossly intact, moves all extremities without gross motor or sensory deficit   ASSESSMENT AND PLAN: .    Assessment and Plan Assessment & Plan Status post ablation for AVNRT with rare palpitations Successful ablation for AVNRT with symptom resolution. Rare palpitations occur without dizziness or lightheadedness. EKG shows improvement with no concerning findings and no current AFib detected.  - Advise reporting any increase in palpitations or symptoms such as dizziness or lightheadedness. - Consider heart monitor if symptoms of dizziness or increased palpitations occur.  Hypertension Blood pressure slightly elevated at 142/70 mmHg during visit, typically around 130 mmHg or lower at home. Comfortable with current blood pressure management given age and overall health. - Continue current antihypertensive regimen with benazepril  HCTZ 20-12.5 mg, half a tablet daily. - Monitor blood pressure at home.  Hyperlipidemia Cholesterol well-controlled with current medication regimen of pravastatin  80 mg daily. - Continue pravastatin  80 mg daily.      Soyla Merck, MD, FACC

## 2023-09-06 ENCOUNTER — Encounter: Payer: Self-pay | Admitting: Pediatrics

## 2023-09-09 DIAGNOSIS — I495 Sick sinus syndrome: Secondary | ICD-10-CM | POA: Diagnosis not present

## 2023-09-09 DIAGNOSIS — F419 Anxiety disorder, unspecified: Secondary | ICD-10-CM | POA: Diagnosis not present

## 2023-09-09 DIAGNOSIS — I10 Essential (primary) hypertension: Secondary | ICD-10-CM | POA: Diagnosis not present

## 2023-09-09 DIAGNOSIS — E785 Hyperlipidemia, unspecified: Secondary | ICD-10-CM | POA: Diagnosis not present

## 2023-09-09 DIAGNOSIS — Z4689 Encounter for fitting and adjustment of other specified devices: Secondary | ICD-10-CM | POA: Diagnosis not present

## 2023-10-05 DIAGNOSIS — N3941 Urge incontinence: Secondary | ICD-10-CM | POA: Diagnosis not present

## 2023-10-05 DIAGNOSIS — Z4689 Encounter for fitting and adjustment of other specified devices: Secondary | ICD-10-CM | POA: Diagnosis not present

## 2023-10-26 DIAGNOSIS — F419 Anxiety disorder, unspecified: Secondary | ICD-10-CM | POA: Diagnosis not present

## 2023-10-26 DIAGNOSIS — E785 Hyperlipidemia, unspecified: Secondary | ICD-10-CM | POA: Diagnosis not present

## 2023-10-26 DIAGNOSIS — I10 Essential (primary) hypertension: Secondary | ICD-10-CM | POA: Diagnosis not present

## 2023-10-26 DIAGNOSIS — R197 Diarrhea, unspecified: Secondary | ICD-10-CM | POA: Diagnosis not present

## 2023-10-26 DIAGNOSIS — Z23 Encounter for immunization: Secondary | ICD-10-CM | POA: Diagnosis not present

## 2023-10-26 LAB — COMPREHENSIVE METABOLIC PANEL (CC13): EGFR: 90

## 2023-11-04 NOTE — Progress Notes (Signed)
 "  Gastroenterology Initial Consultation   Referring Provider Teresa Channel, MD [address] [place],  [place] [address]  Primary Care Provider Teresa Channel, MD  Patient Profile: Anna Dawson is a 83 y.o. female who is seen in consultation in the [facility] at the request of Dr. Teresa for evaluation and management of the problem(s) noted below.  Problem List: Diarrhea GERD Family history of colorectal cancer in a first-degree relative-mother Colonic diverticulosis Vitamin B12 deficiency   History of Present Illness    Discussed the use of AI scribe software for clinical note transcription with the patient, who gave verbal consent to proceed.  History of Present Illness Anna Dawson is an 83 year old woman with a past medical history noteworthy for SVT, sick sinus syndrome, HTN, HLD and pulmonary hypertension who presents for evaluation and management of altered bowel habits and diarrhea  Diarrhea and altered bowel habits - Onset in July 2025 following venlafaxine use, with severe episodes after three doses - Diarrhea persists intermittently despite discontinuation of venlafaxine but has consistently improved since that time - Current frequency: two to three bowel movements daily; previously once or twice daily - Stool consistency: predominantly soft, with occasional liquid stools once or twice a week - Nocturnal bowel movements absent recently, indicating some improvement - No blood or mucus in stools - No sharp abdominal pain, cramping, nausea, or vomiting - Occasional gas and bloating - No prior history of similar gastrointestinal symptoms except for brief episodes related to dairy intake  - [facility] 10/30/23: CBC, CMP, celiac panel normal  Dietary modifications and triggers - Avoids dairy and spicy foods - Lactose intolerant; uses lactose-free products or lactase supplements - Fiber supplements (Benefiber) worsen symptoms -  Probiotics (Align) provide slight relief; previously tried Culturelle - Occasional use of Pepto Bismol  Weight loss and appetite changes - Unintentional weight loss of 13 to 15 pounds - Decreased appetite and smaller meal portions  GERD - Symptoms currently well-controlled - Has used Pepcid  on an as-needed basis when symptoms occur with benefit - Adheres to dietary modification  Family history of gastrointestinal malignancy - Mother died of colon cancer in her seventies   GI Review of Symptoms Significant for loose stool. Otherwise negative.  General Review of Systems  Review of systems is significant for the pertinent positives and negatives as listed per the HPI.  Full ROS is otherwise negative.  Past Medical History   Past Medical History:  Diagnosis Date   Anxiety    Arthritis    right knee   Cataracts, bilateral 01/27/1992   GERD (gastroesophageal reflux disease)    Hyperlipidemia    Hypertension    Seasonal allergies    Sick sinus syndrome (HCC)    SVT (supraventricular tachycardia)      Past Surgical History   Past Surgical History:  Procedure Laterality Date   CATARACT EXTRACTION, BILATERAL  1994   COLONOSCOPY  2006   EYELID SURGERY  2006   BILATERAL   KNEE ARTHROSCOPY  2011   RIGHT KNEE   RETINAL LASER PROCEDURE  1995   SUPRAVENTRICULAR TACHYCARDIA ABLATION N/A 02/23/2014   Procedure: SUPRAVENTRICULAR TACHYCARDIA ABLATION;  Surgeon: Lynwood Rakers, MD;  Location: [facility] CATH LAB;  Service: Cardiovascular;  Laterality: N/A;   TUBAL LIGATION       Allergies and Medications   Allergies  Allergen Reactions   Amlodipine Besylate     Causes heart to race and swelling in both ankles    Effexor [Venlafaxine] Diarrhea  Current Meds  Medication Sig   benazepril -hydrochlorthiazide (LOTENSIN  HCT) 20-12.5 MG tablet Take 0.5 tablets by mouth daily.   Cholecalciferol 50 MCG (2000 UT) CAPS Take 1 capsule by mouth daily.   escitalopram (LEXAPRO) 5 MG tablet  Take 5 mg by mouth daily.   famotidine  (PEPCID ) 20 MG tablet Take 20 mg by mouth daily.   OVER THE COUNTER MEDICATION Apply 1 application topically daily as needed (stress relief/ sleep). Essential Oil   pravastatin  (PRAVACHOL ) 80 MG tablet Take 1 tablet (80 mg total) by mouth daily.   Probiotic Product (ALIGN) 10 MG CAPS Take 10 mg by mouth daily.   triamcinolone cream (KENALOG) 0.1 % Apply topically 2 (two) times daily.   Turmeric 450 MG CAPS Take 450 mg by mouth daily.     Family History   Family History  Problem Relation Age of Onset   CVA Father    Hypertension Father    Hypercholesterolemia Father    Colon cancer Mother    Hypertension Mother    Hypertension Sister    Hypercholesterolemia Sister    Arrhythmia Sister    Lung cancer Sister        non-smoker   Mitral valve prolapse Sister      Social History   Social History   Tobacco Use   Smoking status: Never   Smokeless tobacco: Never  Vaping Use   Vaping status: Never Used  Substance Use Topics   Alcohol use: No    Comment: occasional    Drug use: Never   Anna Dawson reports that she has never smoked. She has never used smokeless tobacco. She reports that she does not drink alcohol and does not use drugs.  Vital Signs and Physical Examination   Vitals:   11/08/23 1002  BP: 136/74  Pulse: (!) 51   Body mass index is 25.15 kg/m. Weight: 137 lb 8 oz (62.4 kg)  General: Well developed, well nourished, no acute distress Head: Normocephalic and atraumatic Eyes: Sclerae anicteric, EOMI Lungs: Clear throughout to auscultation Heart: Regular rate and rhythm; No murmurs, rubs or bruits Abdomen: Soft, non tender and non distended. No masses, hepatosplenomegaly or hernias noted. Normal Bowel sounds Rectal:Deferred Musculoskeletal: Symmetrical with no gross deformities     Review of Data  The following data was reviewed at the time of this encounter:  Laboratory Studies      Latest Ref Rng & Units  02/19/2014   11:28 AM 04/25/2009   12:10 PM  CBC  WBC 4.0 - 10.5 K/uL 6.1    Hemoglobin 12.0 - 15.0 g/dL 84.9  85.0   Hematocrit 36.0 - 46.0 % 44.2    Platelets 150.0 - 400.0 K/uL 285.0      No results found for: LIPASE    Latest Ref Rng & Units 02/19/2014   11:28 AM 04/22/2009    3:00 PM  CMP  Glucose 70 - 99 mg/dL 86  882   BUN 6 - 23 mg/dL 15  18   Creatinine 9.59 - 1.20 mg/dL 9.29  9.34   Sodium 864 - 145 mEq/L 139  139   Potassium 3.5 - 5.1 mEq/L 3.6  4.1   Chloride 96 - 112 mEq/L 105  107   CO2 19 - 32 mEq/L 28  24   Calcium 8.4 - 10.5 mg/dL 9.4  9.6    [facility] 10/30/23: CBC, CMP, celiac panel normal  Imaging Studies  None  GI Procedures and Studies  Colonoscopy 05/13/2011 Mild diverticulosis 4 mm rectal  polyp -lymphoid aggregate  Clinical Impression  It is my clinical impression that Anna Dawson is a 83 y.o. female with;  Diarrhea GERD Family history of colorectal cancer in a first-degree relative-mother Colonic diverticulosis Vitamin B12 deficiency  Anna Dawson presents to the office today for evaluation and management of diarrhea which began acutely in July 2025 after brief venlafaxine use.  She states that she took 3 doses after which she developed severe diarrhea.  Her symptoms have gradually improved but she has not reestablished normal bowel habits.  Laboratory testing by her PCP showed a normal CBC, CMP and celiac panel.  She has tried dietary modification as well as increasing fiber in her diet without resolution of symptoms.  She has also explored the possibility of probiotics using Culturelle and align.  We discussed that the differential diagnosis for her symptoms could include a chronic intestinal infection, inflammatory bowel disease, SIBO, alpha gal syndrome, hypothyroidism, pancreatic insufficiency, microscopic colitis triggered by venlafaxine use, bile acid malabsorption, IBS-D.  At today's visit, I had recommended performing laboratory and stool testing  for initial evaluation of her symptoms.  Labs obtained today did show evidence of significant vitamin B12 deficiency which may or may not be related to her diarrhea.  I have advised supplementation with IM injections.  Anna Dawson has a history of GERD which is currently well-controlled by dietary modification and as needed Pepcid .  Plan  Labs today: TSH, alpha gal panel, ESR, CRP, vitamin B12 level, folate, stool culture, ova parasite, fecal calprotectin, fecal fat and pancreatic elastase Continue align probiotic May use Imodium or Pepto-Bismol as needed If laboratory and stool testing are unrevealing and symptoms persist consider the possibility of testing for SIBO Consider trial of rifaximin for possible IBS-D Consider trial of Colestid and/or checking 7 alpha C4 If workup as outlined above is negative and symptoms are not improving consider flexible sigmoidoscopy versus colonoscopy to evaluate for microscopic colitis. Start vitamin B12 supplementation with vitamin B12 injections 1000 mcg IM every 2 weeks x 2 months then monthly Continue dietary modification and Pepcid  as needed for GERD management Monitor weight and anthropometrics.  Planned Follow Up 3 months  The patient or caregiver verbalized understanding of the material covered, with no barriers to understanding. All questions were answered. Patient or caregiver is agreeable with the plan outlined above.    It was a pleasure to see Anna Dawson.  If you have any questions or concerns regarding this evaluation, do not hesitate to contact me.  Inocente Hausen, MD [facility]   I spent total of 45 minutes in both face-to-face (25 minutes interview) and non-face-to-face (20 minutes chart review, care coordination, documentation)  activities, excluding procedures performed, for the visit on the date of this encounter.  "

## 2023-11-08 ENCOUNTER — Other Ambulatory Visit (INDEPENDENT_AMBULATORY_CARE_PROVIDER_SITE_OTHER)

## 2023-11-08 ENCOUNTER — Ambulatory Visit: Admitting: Pediatrics

## 2023-11-08 ENCOUNTER — Encounter: Payer: Self-pay | Admitting: Pediatrics

## 2023-11-08 VITALS — BP 136/74 | HR 51 | Temp 98.6°F | Resp 18 | Ht 62.0 in | Wt 137.5 lb

## 2023-11-08 DIAGNOSIS — E538 Deficiency of other specified B group vitamins: Secondary | ICD-10-CM | POA: Diagnosis not present

## 2023-11-08 DIAGNOSIS — K219 Gastro-esophageal reflux disease without esophagitis: Secondary | ICD-10-CM | POA: Diagnosis not present

## 2023-11-08 DIAGNOSIS — E739 Lactose intolerance, unspecified: Secondary | ICD-10-CM

## 2023-11-08 DIAGNOSIS — Z800 Family history of malignant neoplasm of digestive organs: Secondary | ICD-10-CM | POA: Diagnosis not present

## 2023-11-08 DIAGNOSIS — R197 Diarrhea, unspecified: Secondary | ICD-10-CM

## 2023-11-08 LAB — B12 AND FOLATE PANEL
Folate: 10.3 ng/mL (ref >5.9)
Vitamin B-12: 183 pg/mL — ABNORMAL LOW (ref 211–911)

## 2023-11-08 LAB — TSH: TSH: 0.64 u[IU]/mL (ref 0.35–5.50)

## 2023-11-08 LAB — SEDIMENTATION RATE: Sed Rate: 6 mm/h (ref 0–30)

## 2023-11-08 LAB — C-REACTIVE PROTEIN: CRP: <0.5 mg/dL (ref 0.5–20.0)

## 2023-11-08 NOTE — Patient Instructions (Signed)
 Your provider has requested that you go to the basement level for lab work before leaving today. Press B on the elevator. The lab is located at the first door on the left as you exit the elevator.  Due to recent changes in healthcare laws, you may see the results of your imaging and laboratory studies on MyChart before your provider has had a chance to review them.  We understand that in some cases there may be results that are confusing or concerning to you. Not all laboratory results come back in the same time frame and the provider may be waiting for multiple results in order to interpret others.  Please give us  48 hours in order for your provider to thoroughly review all the results before contacting the office for clarification of your results.   Follow up in 3 months or sooner if needed.  Thank you for entrusting me with your care and for choosing [facility], Dr. Inocente Hausen  _______________________________________________________  If your blood pressure at your visit was 140/90 or greater, please contact your primary care physician to follow up on this.  _______________________________________________________  If you are age 80 or older, your body mass index should be between 23-30. Your Body mass index is 25.15 kg/m. If this is out of the aforementioned range listed, please consider follow up with your Primary Care Provider.  If you are age 41 or younger, your body mass index should be between 19-25. Your Body mass index is 25.15 kg/m. If this is out of the aformentioned range listed, please consider follow up with your Primary Care Provider.   ________________________________________________________  The [facility] providers would like to encourage you to use MYCHART to communicate with providers for non-urgent requests or questions.  Due to long hold times on the telephone, sending your provider a message by [facility] may be a faster and more efficient way to get a  response.  Please allow 48 business hours for a response.  Please remember that this is for non-urgent requests.  _______________________________________________________  [facility] is using a team-based approach to care.  Your team is made up of your doctor and two to three APPS. Our APPS (Nurse Practitioners and Physician Assistants) work with your physician to ensure care continuity for you. They are fully qualified to address your health concerns and develop a treatment plan. They communicate directly with your gastroenterologist to care for you. Seeing the Advanced Practice Practitioners on your physician's team can help you by facilitating care more promptly, often allowing for earlier appointments, access to diagnostic testing, procedures, and other specialty referrals.

## 2023-11-09 ENCOUNTER — Ambulatory Visit: Payer: Self-pay | Admitting: Pediatrics

## 2023-11-10 ENCOUNTER — Other Ambulatory Visit

## 2023-11-10 ENCOUNTER — Other Ambulatory Visit: Payer: Self-pay

## 2023-11-10 DIAGNOSIS — R7989 Other specified abnormal findings of blood chemistry: Secondary | ICD-10-CM

## 2023-11-10 DIAGNOSIS — E739 Lactose intolerance, unspecified: Secondary | ICD-10-CM

## 2023-11-10 DIAGNOSIS — R197 Diarrhea, unspecified: Secondary | ICD-10-CM

## 2023-11-10 MED ORDER — CYANOCOBALAMIN 1000 MCG/ML IJ SOLN: INTRAMUSCULAR | 6 refills | Status: DC

## 2023-11-10 MED ORDER — CYANOCOBALAMIN 1000 MCG/ML IJ SOLN: 1000.0000 ug | Freq: Once | INTRAMUSCULAR | 0 refills | Status: DC

## 2023-11-11 ENCOUNTER — Ambulatory Visit

## 2023-11-11 ENCOUNTER — Telehealth: Payer: Self-pay

## 2023-11-11 DIAGNOSIS — E538 Deficiency of other specified B group vitamins: Secondary | ICD-10-CM | POA: Diagnosis not present

## 2023-11-11 LAB — ALPHA-GAL PANEL
Allergen, Mutton, f88: <0.1 kU/L
Allergen, Pork, f26: <0.1 kU/L
Beef: <0.1 kU/L
CLASS: 0
CLASS: 0
Class: 0
GALACTOSE-ALPHA-1,3-GALACTOSE IGE*: <0.1 kU/L (ref <0.10)

## 2023-11-11 LAB — INTERPRETATION:

## 2023-11-11 MED ORDER — CYANOCOBALAMIN 1000 MCG/ML IJ SOLN
1000.0000 ug | Freq: Once | INTRAMUSCULAR | Status: AC
  Administered 2023-11-11: 1000 ug via INTRAMUSCULAR

## 2023-11-11 NOTE — Telephone Encounter (Signed)
 Patient was here for her B12 injection this morning & stated she did not have any further B12 refills. Reviewed chart & Beth, LPN sent further refills yesterday so it should be on file with pharmacy. She's been advised if she has any issues with refills to give us  a call back.

## 2023-11-12 LAB — FECAL FAT, QUALITATIVE: FECAL FAT, QUALITATIVE: NORMAL

## 2023-11-14 LAB — STOOL CULTURE: E coli, Shiga toxin Assay: NEGATIVE

## 2023-11-15 ENCOUNTER — Ambulatory Visit: Payer: Self-pay | Admitting: Pediatrics

## 2023-11-17 LAB — OVA AND PARASITE EXAMINATION
CONCENTRATE RESULT:: NONE SEEN
MICRO NUMBER:: 17102565
SPECIMEN QUALITY:: ADEQUATE
TRICHROME RESULT:: NONE SEEN

## 2023-11-17 LAB — CALPROTECTIN: Calprotectin: 144 ug/g — ABNORMAL HIGH

## 2023-11-18 LAB — PANCREATIC ELASTASE, FECAL: Pancreatic Elastase-1, Stool: 278 ug/g (ref >200)

## 2023-11-23 ENCOUNTER — Telehealth: Payer: Self-pay | Admitting: Pediatrics

## 2023-11-23 NOTE — Telephone Encounter (Signed)
 Called & spoke with patient. Patient wanted to know steps to proceed with SIBO. I told patient that Medicare will likely not cover the test & she would be responsible for the payment. I did make patient aware that there are payment plan options or financial assistance that [facility] offers. Patient did not want to go that route. Patient would like to proceed with antibiotic treatment at this time. Patient wanted to know if it would be reasonable to take 1 course of treatment, if no improvement she wanted to perform the breath test. I told patient that this may alter the results but I would check with MD. Patient would like RX sent to [facility] on file. Please advise, thanks.

## 2023-11-23 NOTE — Telephone Encounter (Signed)
 Inbound call from patient stating that Dr. Suzann would like her to complete a SIBO test. Patient is requesting a call to discuss. She states she wants to have the test done in [place] because her friend had to take one and had to go to [place]. Please advise.

## 2023-11-24 MED ORDER — METRONIDAZOLE 250 MG PO TABS: 250.0000 mg | ORAL_TABLET | Freq: Three times a day (TID) | ORAL | 0 refills | Status: DC

## 2023-11-24 NOTE — Telephone Encounter (Signed)
 Called & spoke with patient regarding plan to treat possible SIBO. I informed patient that Dr. Suzann has prescribed Flagyl 250 mg TID for 14 days. Patient has been informed that she can take Flagyl with or w/o food, take with food if it causes stomach upset. I advised patient to let us  know how she is doing on the last day of treatment, if symptoms have improved but not completely resolved Dr. Suzann would like to extend prescription for another 2 weeks. I advised patient to let us  know if she does not see any improvement as well. Patient is aware that if she continues to be symptomatic despite treatment we can have her submit SIBO breath test after a few weeks of completing the antibiotic. Patient also mentioned needing to schedule 33-month follow up, I informed patient that we do not have our January schedule at this time & to check back at a later date to schedule. Patient will follow up on symptoms via MyChart in a couple of weeks. Patient verbalized understanding & had no concerns at the end of the call.   Flagyl [facility] on [facility], per pt request.

## 2023-11-24 NOTE — Addendum Note (Signed)
 Addended by: MERCER CRISTINO SAILOR on: 11/24/2023 08:26 AM   Modules accepted: Orders

## 2023-11-25 ENCOUNTER — Ambulatory Visit

## 2023-11-25 DIAGNOSIS — E538 Deficiency of other specified B group vitamins: Secondary | ICD-10-CM

## 2023-11-25 MED ORDER — CYANOCOBALAMIN 1000 MCG/ML IJ SOLN
1000.0000 ug | Freq: Once | INTRAMUSCULAR | Status: AC
  Administered 2023-11-25: 1000 ug via INTRAMUSCULAR

## 2023-11-30 DIAGNOSIS — Z1231 Encounter for screening mammogram for malignant neoplasm of breast: Secondary | ICD-10-CM | POA: Diagnosis not present

## 2023-12-05 ENCOUNTER — Encounter: Payer: Self-pay | Admitting: Pediatrics

## 2023-12-06 MED ORDER — METRONIDAZOLE 250 MG PO TABS: 250.0000 mg | ORAL_TABLET | Freq: Three times a day (TID) | ORAL | 0 refills | Status: AC

## 2023-12-08 ENCOUNTER — Ambulatory Visit

## 2023-12-08 DIAGNOSIS — E538 Deficiency of other specified B group vitamins: Secondary | ICD-10-CM | POA: Diagnosis not present

## 2023-12-08 DIAGNOSIS — R7989 Other specified abnormal findings of blood chemistry: Secondary | ICD-10-CM

## 2023-12-08 MED ORDER — CYANOCOBALAMIN 1000 MCG/ML IJ SOLN
1000.0000 ug | Freq: Once | INTRAMUSCULAR | Status: AC
  Administered 2023-12-08: 1000 ug via INTRAMUSCULAR

## 2023-12-22 ENCOUNTER — Ambulatory Visit

## 2023-12-22 DIAGNOSIS — R7989 Other specified abnormal findings of blood chemistry: Secondary | ICD-10-CM

## 2023-12-22 DIAGNOSIS — E538 Deficiency of other specified B group vitamins: Secondary | ICD-10-CM | POA: Diagnosis not present

## 2023-12-22 MED ORDER — CYANOCOBALAMIN 1000 MCG/ML IJ SOLN
1000.0000 ug | Freq: Once | INTRAMUSCULAR | Status: AC
  Administered 2023-12-22: 1000 ug via INTRAMUSCULAR

## 2024-01-14 ENCOUNTER — Ambulatory Visit

## 2024-01-14 DIAGNOSIS — R7989 Other specified abnormal findings of blood chemistry: Secondary | ICD-10-CM | POA: Diagnosis not present

## 2024-01-14 DIAGNOSIS — E538 Deficiency of other specified B group vitamins: Secondary | ICD-10-CM | POA: Diagnosis not present

## 2024-01-14 MED ORDER — CYANOCOBALAMIN 1000 MCG/ML IJ SOLN
1000.0000 ug | Freq: Once | INTRAMUSCULAR | Status: AC
  Administered 2024-01-14: 1000 ug via INTRAMUSCULAR

## 2024-02-06 NOTE — Progress Notes (Unsigned)
 "  Gastroenterology Return Visit   Referring Provider Teresa Channel, MD [address] [place],  [place] [address]  Primary Care Provider Teresa Channel, MD  Patient Profile: Anna Dawson is a 84 y.o. female who returns to the [facility]enter[facility] office for follow-up of the problem(s) noted below.  Problem List: Diarrhea-possible SIBO Weight loss GERD Family history of colorectal cancer in a first-degree relative-mother Colonic diverticulosis Vitamin B12 deficiency   History of Present Illness    Discussed the use of AI scribe software for clinical note transcription with the patient, who gave verbal consent to proceed.  History of Present Illness Anna Dawson is an 84 year old woman with a past medical history noteworthy for SVT, sick sinus syndrome, HTN, HLD and pulmonary hypertension who presents for follow-up of altered bowel habits and diarrhea  Diarrhea and altered bowel habits - Onset in July 2025 following venlafaxine use, with severe episodes after three doses - Diarrhea persists intermittently despite discontinuation of venlafaxine but has consistently improved since that time - Current frequency: two to three bowel movements daily; previously once or twice daily - Stool consistency: predominantly soft, with occasional liquid stools once or twice a week - Nocturnal bowel movements absent recently, indicating some improvement - No blood or mucus in stools - No sharp abdominal pain, cramping, nausea, or vomiting - Occasional gas and bloating - No prior history of similar gastrointestinal symptoms except for brief episodes related to dairy intake  - [facility] 10/30/23: CBC, CMP, celiac panel normal  Dietary modifications and triggers - Avoids dairy and spicy foods - Lactose intolerant; uses lactose-free products or lactase supplements - Fiber supplements (Benefiber) worsen symptoms - Probiotics (Align) provide slight relief; previously tried  Culturelle - Occasional use of Pepto Bismol  Weight loss and appetite changes - Unintentional weight loss of 13 to 15 pounds - Decreased appetite and smaller meal portions  GERD - Symptoms currently well-controlled - Has used Pepcid  on an as-needed basis when symptoms occur with benefit - Adheres to dietary modification  Family history of gastrointestinal malignancy - Mother died of colon cancer in her seventies   GI Review of Symptoms Significant for loose stool. Otherwise negative.  General Review of Systems  Review of systems is significant for the pertinent positives and negatives as listed per the HPI.  Full ROS is otherwise negative.  Past Medical History   Past Medical History:  Diagnosis Date   Anxiety    Arthritis    right knee   Cataracts, bilateral 01/27/1992   GERD (gastroesophageal reflux disease)    Hyperlipidemia    Hypertension    Seasonal allergies    Sick sinus syndrome (HCC)    SVT (supraventricular tachycardia)      Past Surgical History   Past Surgical History:  Procedure Laterality Date   CATARACT EXTRACTION, BILATERAL  1994   COLONOSCOPY  2006   EYELID SURGERY  2006   BILATERAL   KNEE ARTHROSCOPY  2011   RIGHT KNEE   RETINAL LASER PROCEDURE  1995   SUPRAVENTRICULAR TACHYCARDIA ABLATION N/A 02/23/2014   Procedure: SUPRAVENTRICULAR TACHYCARDIA ABLATION;  Surgeon: Lynwood Rakers, MD;  Location: [facility] CATH LAB;  Service: Cardiovascular;  Laterality: N/A;   TUBAL LIGATION       Allergies and Medications   Allergies  Allergen Reactions   Amlodipine Besylate     Causes heart to race and swelling in both ankles    Effexor [Venlafaxine] Diarrhea    No outpatient medications have been marked as  taking for the 02/08/24 encounter (Appointment) with Zyaire Mccleod, Inocente HERO, MD.     Family History   Family History  Problem Relation Age of Onset   CVA Father    Hypertension Father    Hypercholesterolemia Father    Colon cancer Mother     Hypertension Mother    Hypertension Sister    Hypercholesterolemia Sister    Arrhythmia Sister    Lung cancer Sister        non-smoker   Mitral valve prolapse Sister      Social History   Social History   Tobacco Use   Smoking status: Never   Smokeless tobacco: Never  Vaping Use   Vaping status: Never Used  Substance Use Topics   Alcohol use: No    Comment: occasional    Drug use: Never   Anna Dawson reports that she has never smoked. She has never used smokeless tobacco. She reports that she does not drink alcohol and does not use drugs.  Vital Signs and Physical Examination   There were no vitals filed for this visit.  There is no height or weight on file to calculate BMI.    General: Well developed, well nourished, no acute distress Head: Normocephalic and atraumatic Eyes: Sclerae anicteric, EOMI Lungs: Clear throughout to auscultation Heart: Regular rate and rhythm; No murmurs, rubs or bruits Abdomen: Soft, non tender and non distended. No masses, hepatosplenomegaly or hernias noted. Normal Bowel sounds Rectal:Deferred Musculoskeletal: Symmetrical with no gross deformities     Review of Data  The following data was reviewed at the time of this encounter:  Laboratory Studies      Latest Ref Rng & Units 02/19/2014   11:28 AM 04/25/2009   12:10 PM  CBC  WBC 4.0 - 10.5 K/uL 6.1    Hemoglobin 12.0 - 15.0 g/dL 84.9  85.0   Hematocrit 36.0 - 46.0 % 44.2    Platelets 150.0 - 400.0 K/uL 285.0      No results found for: LIPASE    Latest Ref Rng & Units 02/19/2014   11:28 AM 04/22/2009    3:00 PM  CMP  Glucose 70 - 99 mg/dL 86  882   BUN 6 - 23 mg/dL 15  18   Creatinine 9.59 - 1.20 mg/dL 9.29  9.34   Sodium 864 - 145 mEq/L 139  139   Potassium 3.5 - 5.1 mEq/L 3.6  4.1   Chloride 96 - 112 mEq/L 105  107   CO2 19 - 32 mEq/L 28  24   Calcium 8.4 - 10.5 mg/dL 9.4  9.6    [facility] 10/30/23: CBC, CMP, celiac panel normal  Labs 10/2023 Fecal calprotectin  144 Elastase and fecal fat normal Ova and parasite negative  Alpha gal negative  Vitamin B12 183 (low) Folate 10.3   Imaging Studies  None  GI Procedures and Studies  Colonoscopy 05/13/2011 Mild diverticulosis 4 mm rectal polyp -lymphoid aggregate  Clinical Impression  It is my clinical impression that Anna Dawson is a 84 y.o. female with;  Diarrhea Weight loss GERD Family history of colorectal cancer in a first-degree relative-mother Colonic diverticulosis Vitamin B12 deficiency  Anna Dawson presents to the office today for evaluation and management of diarrhea which began acutely in July 2025 after brief venlafaxine use.  She states that she took 3 doses after which she developed severe diarrhea.  Her symptoms have gradually improved but she has not reestablished normal bowel habits.  Laboratory testing by her PCP showed a  normal CBC, CMP and celiac panel.  She has tried dietary modification as well as increasing fiber in her diet without resolution of symptoms.  She has also explored the possibility of probiotics using Culturelle and align.  We discussed that the differential diagnosis for her symptoms could include a chronic intestinal infection, inflammatory bowel disease, SIBO, alpha gal syndrome, hypothyroidism, pancreatic insufficiency, microscopic colitis triggered by venlafaxine use, bile acid malabsorption, IBS-D.  At today's visit, I had recommended performing laboratory and stool testing for initial evaluation of her symptoms.  Labs obtained today did show evidence of significant vitamin B12 deficiency which may or may not be related to her diarrhea.  I have advised supplementation with IM injections.  Anna Dawson has a history of GERD which is currently well-controlled by dietary modification and as needed Pepcid .  Plan  Labs today: TSH, alpha gal panel, ESR, CRP, vitamin B12 level, folate, stool culture, ova parasite, fecal calprotectin, fecal fat and pancreatic  elastase Continue align probiotic May use Imodium or Pepto-Bismol as needed If laboratory and stool testing are unrevealing and symptoms persist consider the possibility of testing for SIBO Consider trial of rifaximin for possible IBS-D Consider trial of Colestid and/or checking 7 alpha C4 If workup as outlined above is negative and symptoms are not improving consider flexible sigmoidoscopy versus colonoscopy to evaluate for microscopic colitis. Start vitamin B12 supplementation with vitamin B12 injections 1000 mcg IM every 2 weeks x 2 months then monthly Continue dietary modification and Pepcid  as needed for GERD management Monitor weight and anthropometrics.  Planned Follow Up 3 months  The patient or caregiver verbalized understanding of the material covered, with no barriers to understanding. All questions were answered. Patient or caregiver is agreeable with the plan outlined above.    It was a pleasure to see Anna Dawson.  If you have any questions or concerns regarding this evaluation, do not hesitate to contact me.  Inocente Hausen, MD [facility]   I spent total of 45 minutes in both face-to-face (25 minutes interview) and non-face-to-face (20 minutes chart review, care coordination, documentation)  activities, excluding procedures performed, for the visit on the date of this encounter.  "

## 2024-02-08 ENCOUNTER — Encounter: Payer: Self-pay | Admitting: Pediatrics

## 2024-02-08 ENCOUNTER — Ambulatory Visit: Admitting: Pediatrics

## 2024-02-08 VITALS — BP 104/54 | HR 57 | Ht 62.0 in | Wt 137.1 lb

## 2024-02-08 DIAGNOSIS — K219 Gastro-esophageal reflux disease without esophagitis: Secondary | ICD-10-CM

## 2024-02-08 DIAGNOSIS — E538 Deficiency of other specified B group vitamins: Secondary | ICD-10-CM

## 2024-02-08 DIAGNOSIS — R634 Abnormal weight loss: Secondary | ICD-10-CM | POA: Diagnosis not present

## 2024-02-08 DIAGNOSIS — K5730 Diverticulosis of large intestine without perforation or abscess without bleeding: Secondary | ICD-10-CM

## 2024-02-08 DIAGNOSIS — R197 Diarrhea, unspecified: Secondary | ICD-10-CM | POA: Diagnosis not present

## 2024-02-08 DIAGNOSIS — Z800 Family history of malignant neoplasm of digestive organs: Secondary | ICD-10-CM

## 2024-02-08 DIAGNOSIS — K638219 Small intestinal bacterial overgrowth, unspecified: Secondary | ICD-10-CM

## 2024-02-08 MED ORDER — CYANOCOBALAMIN 1000 MCG/ML IJ SOLN
1000.0000 ug | INTRAMUSCULAR | Status: AC
  Administered 2024-02-08: 1000 ug via INTRAMUSCULAR

## 2024-02-08 NOTE — Patient Instructions (Addendum)
 Your provider has requested that you go to the basement level for lab work in March.  You will return to this building but go straight to the lab. The lab is located at the first door on the left as you exit the elevator in the basement.  Due to recent changes in healthcare laws, you may see the results of your imaging and laboratory studies on MyChart before your provider has had a chance to review them.  We understand that in some cases there may be results that are confusing or concerning to you. Not all laboratory results come back in the same time frame and the provider may be waiting for multiple results in order to interpret others.  Please give us  48 hours in order for your provider to thoroughly review all the results before contacting the office for clarification of your results.   Follow up in 1 year or sooner if needed.  Thank you for entrusting me with your care and for choosing [facility], Dr. Inocente Hausen  _______________________________________________________  If your blood pressure at your visit was 140/90 or greater, please contact your primary care physician to follow up on this.  _______________________________________________________  If you are age 40 or older, your body mass index should be between 23-30. Your Body mass index is 25.08 kg/m. If this is out of the aforementioned range listed, please consider follow up with your Primary Care Provider.  If you are age 31 or younger, your body mass index should be between 19-25. Your Body mass index is 25.08 kg/m. If this is out of the aformentioned range listed, please consider follow up with your Primary Care Provider.   ________________________________________________________  The [facility] providers would like to encourage you to use MYCHART to communicate with providers for non-urgent requests or questions.  Due to long hold times on the telephone, sending your provider a message by [facility] may be a faster and  more efficient way to get a response.  Please allow 48 business hours for a response.  Please remember that this is for non-urgent requests.  _______________________________________________________  [facility] is using a team-based approach to care.  Your team is made up of your doctor and two to three APPS. Our APPS (Nurse Practitioners and Physician Assistants) work with your physician to ensure care continuity for you. They are fully qualified to address your health concerns and develop a treatment plan. They communicate directly with your gastroenterologist to care for you. Seeing the Advanced Practice Practitioners on your physician's team can help you by facilitating care more promptly, often allowing for earlier appointments, access to diagnostic testing, procedures, and other specialty referrals.

## 2024-03-10 ENCOUNTER — Ambulatory Visit
